# Patient Record
Sex: Male | Born: 1960
Health system: Southern US, Community
[De-identification: ages and names within clinical notes are randomized; demographics above are authoritative.]

## PROBLEM LIST (undated history)

## (undated) DIAGNOSIS — B009 Herpesviral infection, unspecified: Secondary | ICD-10-CM

## (undated) DIAGNOSIS — Z973 Presence of spectacles and contact lenses: Secondary | ICD-10-CM

## (undated) DIAGNOSIS — L309 Dermatitis, unspecified: Secondary | ICD-10-CM

## (undated) DIAGNOSIS — C61 Malignant neoplasm of prostate: Secondary | ICD-10-CM

## (undated) DIAGNOSIS — R202 Paresthesia of skin: Secondary | ICD-10-CM

## (undated) DIAGNOSIS — K219 Gastro-esophageal reflux disease without esophagitis: Secondary | ICD-10-CM

## (undated) DIAGNOSIS — E785 Hyperlipidemia, unspecified: Secondary | ICD-10-CM

## (undated) HISTORY — PX: OTHER SURGICAL HISTORY: SHX169

## (undated) HISTORY — DX: Hyperlipidemia, unspecified: E78.5

## (undated) HISTORY — DX: Herpesviral infection, unspecified: B00.9

## (undated) HISTORY — DX: Paresthesia of skin: R20.2

---

## 2013-02-08 ENCOUNTER — Encounter: Payer: Self-pay | Admitting: Neurology

## 2013-02-08 DIAGNOSIS — E785 Hyperlipidemia, unspecified: Secondary | ICD-10-CM | POA: Insufficient documentation

## 2013-02-08 DIAGNOSIS — R202 Paresthesia of skin: Secondary | ICD-10-CM | POA: Insufficient documentation

## 2013-02-13 ENCOUNTER — Encounter: Payer: Self-pay | Admitting: Neurology

## 2013-02-13 ENCOUNTER — Ambulatory Visit (INDEPENDENT_AMBULATORY_CARE_PROVIDER_SITE_OTHER): Payer: BC Managed Care – PPO | Admitting: Neurology

## 2013-02-13 VITALS — BP 147/94 | HR 62 | Ht 68.0 in | Wt 185.0 lb

## 2013-02-13 DIAGNOSIS — R209 Unspecified disturbances of skin sensation: Secondary | ICD-10-CM

## 2013-02-13 DIAGNOSIS — R202 Paresthesia of skin: Secondary | ICD-10-CM

## 2013-02-13 DIAGNOSIS — E785 Hyperlipidemia, unspecified: Secondary | ICD-10-CM

## 2013-02-13 NOTE — Progress Notes (Signed)
[facility]  PATIENT: Peter Stevens DOB: 1961/04/25  HISTORICAL  Peter Stevens is a 52 years old right-handed Caucasian male, referred by his primary care physician Dr. Kirby Funk for evaluation of intermittent paresthesia.  He had past medical history of hyperlipidemia, anxiety, since August 2014, he complains of worsening anxiety, also complains of intermittent episode of patchy area of numbness, involving his arms, legs, he denies weakness, no visual loss, no low back pain Symptoms are intermittent, getting worse under stressful situations  He is concerned about the possibility of MS, cervical spine lesions after surfing the internet, he desires further evaluations  REVIEW OF SYSTEMS: Full 14 system review of systems performed and notable only for ringing in ears, snoring, allergy, headache, numbness, dizziness  ALLERGIES: Allergies  Allergen Reactions  . Lipitor [Atorvastatin]   . Penicillins     HOME MEDICATIONS: Outpatient Prescriptions Prior to Visit  Medication Sig Dispense Refill  . ezetimibe (ZETIA) 10 MG tablet Take 10 mg by mouth daily.      . vitamin B-12 (CYANOCOBALAMIN) 1000 MCG tablet Take 1,000 mcg by mouth daily.       No facility-administered medications prior to visit.    PAST MEDICAL HISTORY: Past Medical History  Diagnosis Date  . Hyperlipemia   . Herpes simplex   . Paresthesia     PAST SURGICAL HISTORY: No past surgical history on file.  FAMILY HISTORY: Family History  Problem Relation Age of Onset  . Prostate cancer Father   . Heart attack Father     SOCIAL HISTORY:  History   Social History  . Marital Status: Single    Spouse Name: N/A    Number of Children: 0   . Years of Education: college   Occupational History  .      Engineer   Social History Main Topics  . Smoking status: Never Smoker   . Smokeless tobacco: Never Used  . Alcohol Use: 2 - 2.5 oz/week    4-5 drink(s) per week     Comment: weekly    . Drug Use: No  . Sexual Activity: Not on file   Other Topics Concern  . Not on file   Social History Narrative   Engineer.   College education BSME   Patient lives with a friend Arnoldo Lenis.    Patient works at [employer].    Patient does not have any children.      PHYSICAL EXAM   Filed Vitals:   02/13/13 0925  BP: 147/94  Pulse: 62  Height: 5\' 8"  (1.727 m)  Weight: 185 lb (83.915 kg)    Not recorded    Body mass index is 28.14 kg/(m^2).   Generalized: In no acute distress  Neck: Supple, no carotid bruits   Cardiac: Regular rate rhythm  Pulmonary: Clear to auscultation bilaterally  Musculoskeletal: No deformity  Neurological examination  Mentation: Alert oriented to time, place, history taking, and causual conversation  Cranial nerve II-XII: Pupils were equal round reactive to light extraocular movements were full, visual field were full on confrontational test. facial sensation and strength were normal. hearing was intact to finger rubbing bilaterally. Uvula tongue midline.  head turning and shoulder shrug and were normal and symmetric.Tongue protrusion into cheek strength was normal.  Motor: normal tone, bulk and strength.  Sensory: Intact to fine touch, pinprick, preserved vibratory sensation, and proprioception at toes.  Coordination: Normal finger to nose, heel-to-shin bilaterally there was no truncal ataxia  Gait: Rising up from  seated position without assistance, normal stance, without trunk ataxia, moderate stride, good arm swing, smooth turning, able to perform tiptoe, and heel walking without difficulty.   Romberg signs: Negative  Deep tendon reflexes: Brachioradialis 2/2, biceps 2/2, triceps 2/2, patellar 2/2, Achilles 2/2, plantar responses were flexor bilaterally.   DIAGNOSTIC DATA (LABS, IMAGING, TESTING) - I reviewed patient records, labs, notes, testing and imaging myself where available.   ASSESSMENT AND PLAN   52 years old  gentleman, complains of anxiety, hyperlipidemia, with intermittent patchy area of paresthesia, normal neurological examination, laboratory evaluations.   He desires further evaluations.  complete evaluation with MRI of the brain, and cervical    Levert Feinstein, M.D. Ph.D.  [facility] [address], [address] [place], [place] [address] [phone]

## 2013-02-14 NOTE — Addendum Note (Signed)
Addended by: Levert Feinstein on: 02/14/2013 01:08 PM   Modules accepted: Orders

## 2013-02-23 ENCOUNTER — Ambulatory Visit (INDEPENDENT_AMBULATORY_CARE_PROVIDER_SITE_OTHER): Payer: BC Managed Care – PPO

## 2013-02-23 DIAGNOSIS — E785 Hyperlipidemia, unspecified: Secondary | ICD-10-CM

## 2013-02-23 DIAGNOSIS — R202 Paresthesia of skin: Secondary | ICD-10-CM

## 2013-02-23 DIAGNOSIS — R209 Unspecified disturbances of skin sensation: Secondary | ICD-10-CM

## 2013-02-27 NOTE — Progress Notes (Signed)
Quick Note:  Called patient and shared Dr Zannie Cove findings as listed below: no answer, lt voicemail message of results. ______

## 2013-02-27 NOTE — Progress Notes (Signed)
Quick Note:  Please call Patient, there is no evidence of MS on MRI brain and cervical spine, age related changes on MRI brain, mild cervical degenerative disease, no intrinsic cord lesions.   ______

## 2013-03-01 NOTE — Progress Notes (Signed)
Quick Note:  Please call patient, MRI cervical showed mild degenerative disease, no acute lesions. ______

## 2014-08-01 ENCOUNTER — Other Ambulatory Visit: Payer: Self-pay | Admitting: Internal Medicine

## 2014-08-01 ENCOUNTER — Ambulatory Visit
Admission: RE | Admit: 2014-08-01 | Discharge: 2014-08-01 | Disposition: A | Discharge status: Home / Self Care | Payer: BLUE CROSS/BLUE SHIELD | Source: Ambulatory Visit | Attending: Internal Medicine | Admitting: Internal Medicine

## 2014-08-01 DIAGNOSIS — R1031 Right lower quadrant pain: Secondary | ICD-10-CM

## 2014-08-01 IMAGING — CT CT ABD-PELV W/ CM
3 of 5 series · 13 of 36 positions shown, 19 images · IV contrast (READICAT/WATER & [ID] ISOVUE 300)
Comparison: None.

CLINICAL DATA: Two day history of right lower quadrant pain

EXAM:
CT ABDOMEN AND PELVIS WITH CONTRAST
TECHNIQUE: Multidetector CT imaging of the abdomen and pelvis was performed
using the standard protocol following bolus administration of
intravenous contrast. Oral contrast was also administered.
CONTRAST:  100 mL Isovue 300 nonionic

[Series 3: abd/pelvis with · axial · 0.76mm/px · z∈[-415,-40]mm · 8 of 96 slices shown, 13 images]
[im 11/96  soft-tissue]
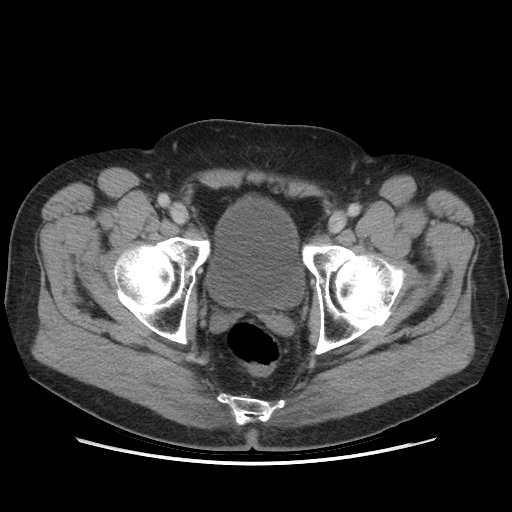
[im 11/96  bone]
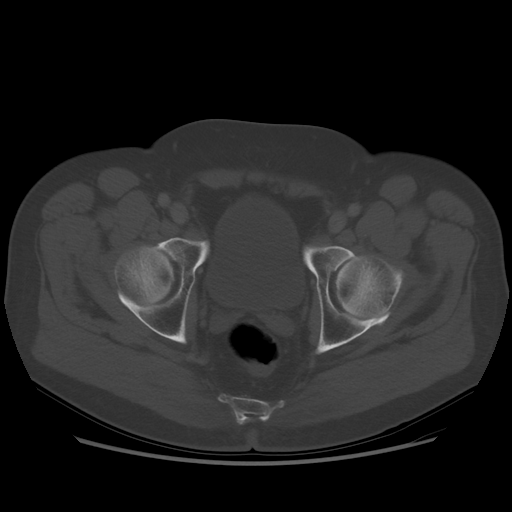
[im 22/96  soft-tissue]
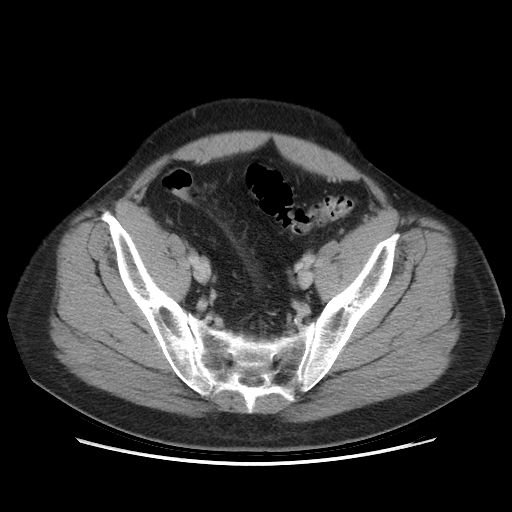
[im 32/96  soft-tissue]
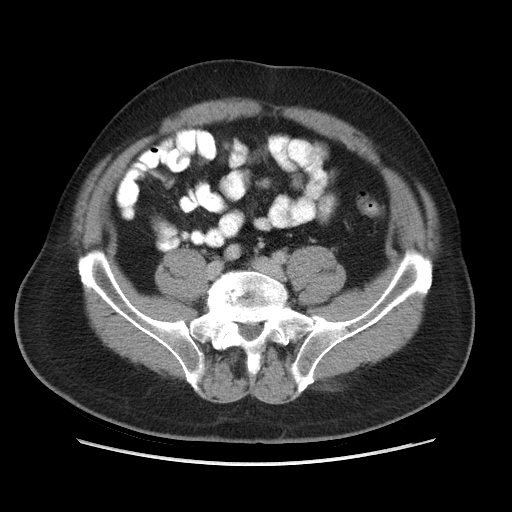
[im 43/96  soft-tissue]
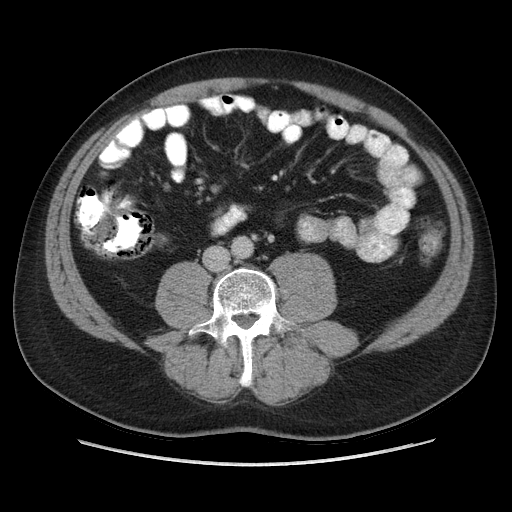
[im 53/96  soft-tissue]
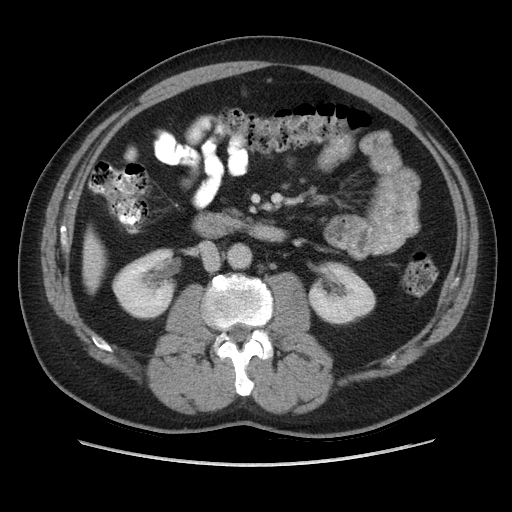
[im 53/96  lung]
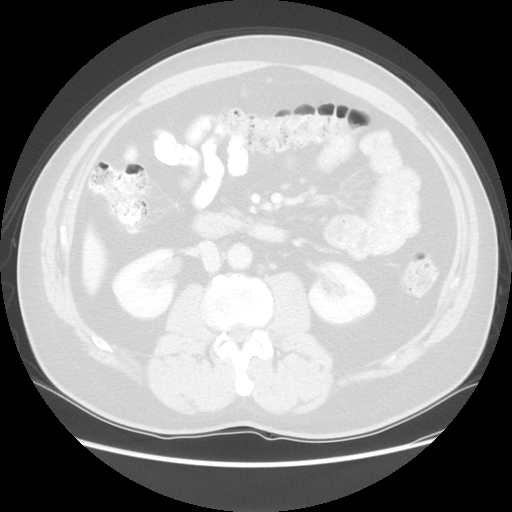
[im 64/96  soft-tissue]
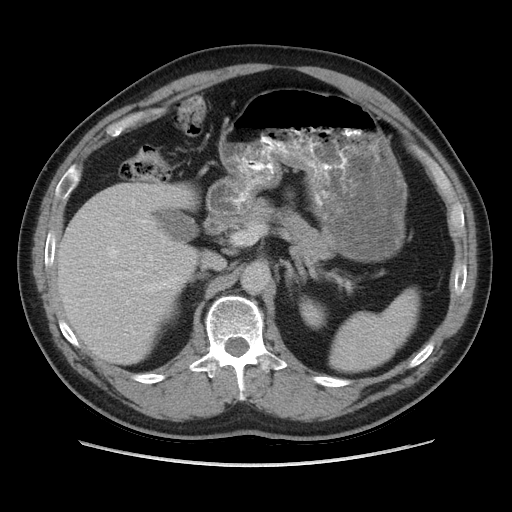
[im 64/96  lung]
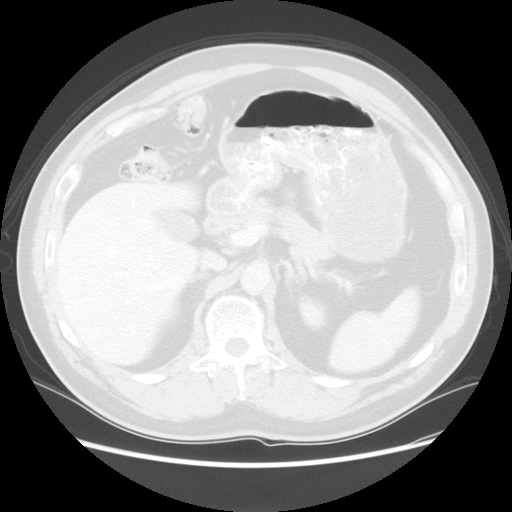
[im 74/96  soft-tissue]
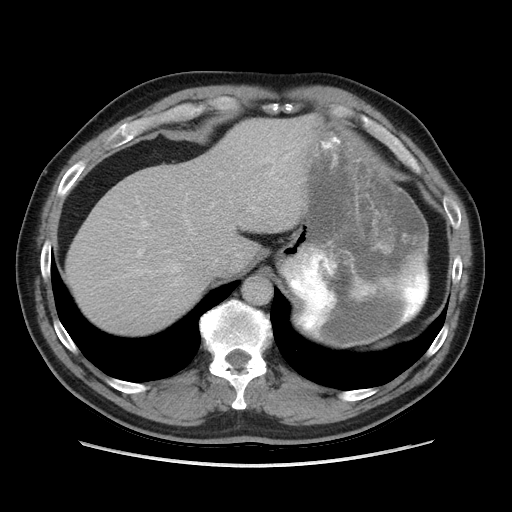
[im 74/96  lung]
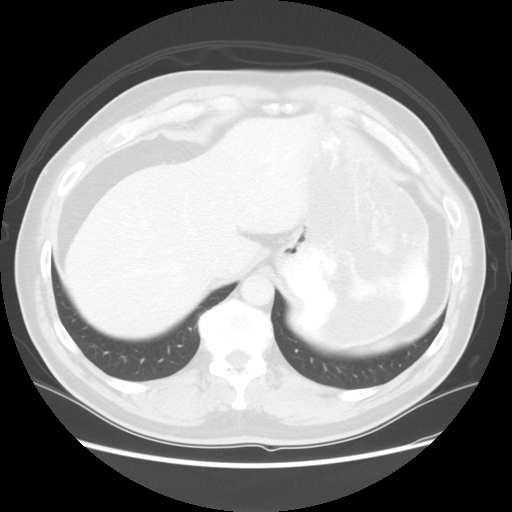
[im 85/96  soft-tissue]
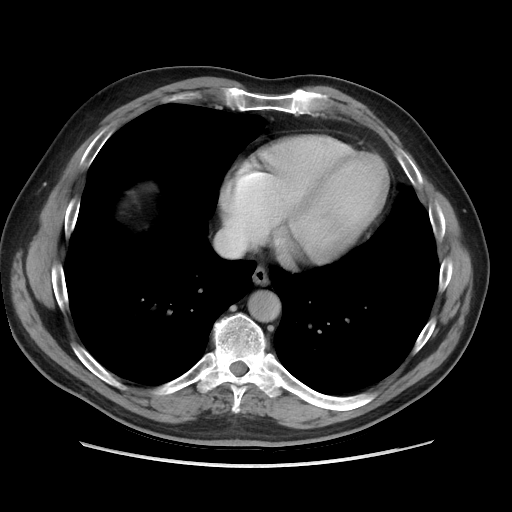
[im 85/96  lung]
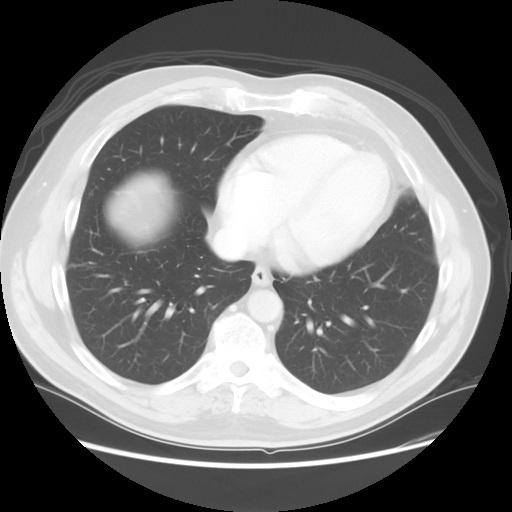

[Series 601: coronal body · coronal · 1.02mm/px · 1 of 130 slices shown, 2 images]
[im 44/130  soft-tissue]
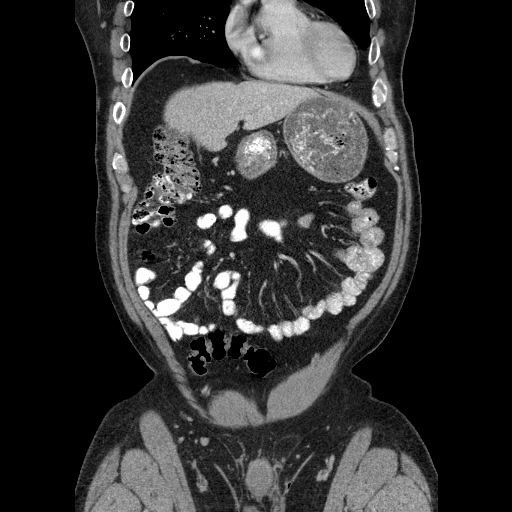
[im 44/130  bone]
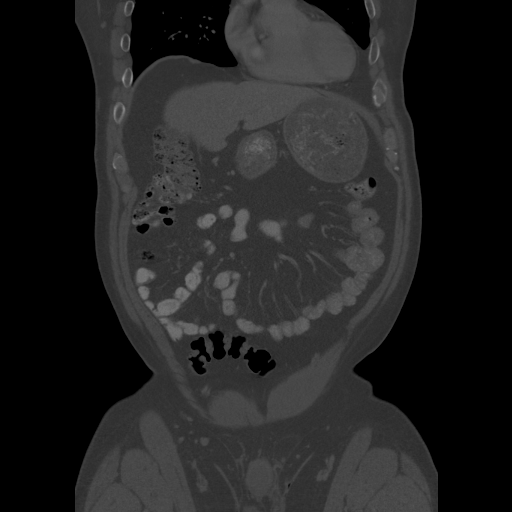

[Series 602: sagittal body · sagittal · 1.02mm/px · 4 of 157 slices shown]
[im 11/157  soft-tissue]
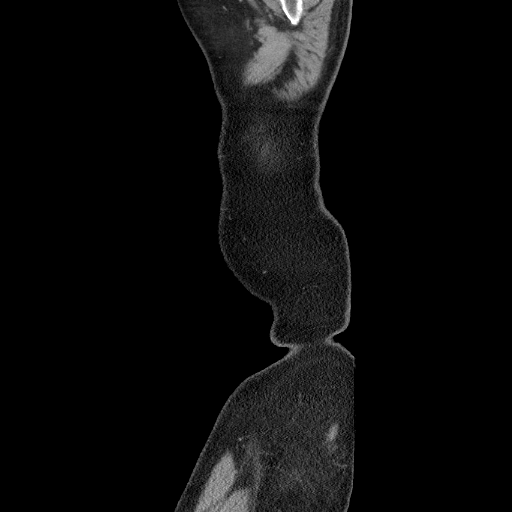
[im 32/157  soft-tissue]
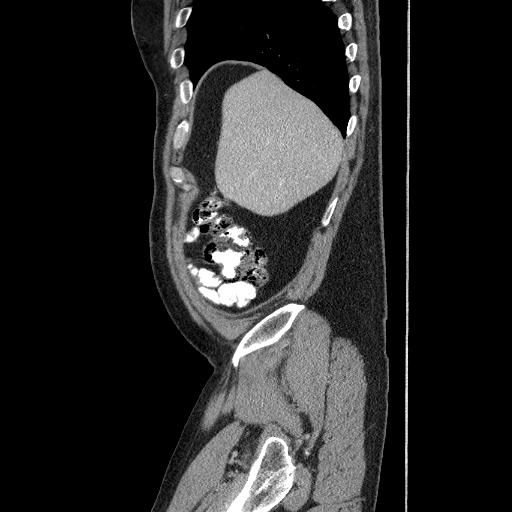
[im 53/157  soft-tissue]
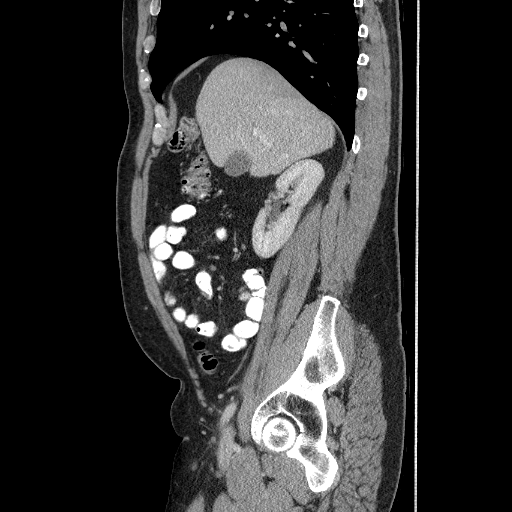
[im 73/157  soft-tissue]
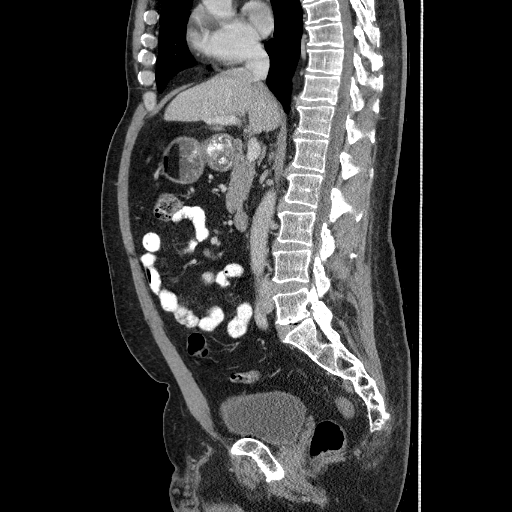

[13 of 36 positions shown; findings below may reference images not displayed]

FINDINGS: Lung bases are clear.

No focal liver lesions are identified. Gallbladder wall is not
thickened. There is no biliary duct dilatation.

Spleen, pancreas, and adrenals appear normal. Kidneys bilaterally
show no appreciable mass or hydronephrosis on either side. There is
no renal or ureteral calculus on either side.

In the pelvis, the urinary bladder is midline with normal wall
thickness. There is no pelvic mass or pelvic fluid collection. There
are scattered sigmoid diverticula without diverticulitis.

Appendix appears within normal limits. There are scattered small
lymph nodes in the right mid to lower quadrant region. There is no
adenopathy in the abdomen or pelvis by size criteria.

There is no bowel obstruction. No free air or portal venous air.
There are scattered diverticula throughout the colon without
diverticulitis.

There is no ascites or abscess in the abdomen or pelvis. There is no
demonstrable abdominal aortic aneurysm. There is degenerative change
in the lumbar spine. No blastic or lytic bone lesions.
IMPRESSION: Appendix appears normal. Nearby small lymph nodes may represent a
degree of mesenteric adenitis.

No bowel obstruction. No abscess. Scattered colonic diverticula
without diverticulitis.

## 2014-08-01 MED ORDER — IOPAMIDOL (ISOVUE-300) INJECTION 61%
100.0000 mL | Freq: Once | INTRAVENOUS | Status: AC | PRN
  Administered 2014-08-01: 100 mL via INTRAVENOUS

## 2016-03-26 DIAGNOSIS — H524 Presbyopia: Secondary | ICD-10-CM | POA: Diagnosis not present

## 2016-09-16 DIAGNOSIS — L989 Disorder of the skin and subcutaneous tissue, unspecified: Secondary | ICD-10-CM | POA: Diagnosis not present

## 2017-01-21 DIAGNOSIS — L989 Disorder of the skin and subcutaneous tissue, unspecified: Secondary | ICD-10-CM | POA: Diagnosis not present

## 2017-01-21 DIAGNOSIS — Z0000 Encounter for general adult medical examination without abnormal findings: Secondary | ICD-10-CM | POA: Diagnosis not present

## 2017-01-21 DIAGNOSIS — Z125 Encounter for screening for malignant neoplasm of prostate: Secondary | ICD-10-CM | POA: Diagnosis not present

## 2017-01-21 DIAGNOSIS — E7800 Pure hypercholesterolemia, unspecified: Secondary | ICD-10-CM | POA: Diagnosis not present

## 2017-01-21 DIAGNOSIS — R070 Pain in throat: Secondary | ICD-10-CM | POA: Diagnosis not present

## 2017-01-21 DIAGNOSIS — R972 Elevated prostate specific antigen [PSA]: Secondary | ICD-10-CM | POA: Diagnosis not present

## 2017-02-01 DIAGNOSIS — Z7289 Other problems related to lifestyle: Secondary | ICD-10-CM | POA: Diagnosis not present

## 2017-02-01 DIAGNOSIS — J343 Hypertrophy of nasal turbinates: Secondary | ICD-10-CM | POA: Diagnosis not present

## 2017-02-01 DIAGNOSIS — K219 Gastro-esophageal reflux disease without esophagitis: Secondary | ICD-10-CM | POA: Diagnosis not present

## 2017-07-12 DIAGNOSIS — L308 Other specified dermatitis: Secondary | ICD-10-CM | POA: Diagnosis not present

## 2017-07-12 DIAGNOSIS — L821 Other seborrheic keratosis: Secondary | ICD-10-CM | POA: Diagnosis not present

## 2017-07-12 DIAGNOSIS — L578 Other skin changes due to chronic exposure to nonionizing radiation: Secondary | ICD-10-CM | POA: Diagnosis not present

## 2017-07-12 DIAGNOSIS — D1801 Hemangioma of skin and subcutaneous tissue: Secondary | ICD-10-CM | POA: Diagnosis not present

## 2017-07-26 DIAGNOSIS — R972 Elevated prostate specific antigen [PSA]: Secondary | ICD-10-CM | POA: Diagnosis not present

## 2017-11-18 DIAGNOSIS — M545 Low back pain: Secondary | ICD-10-CM | POA: Diagnosis not present

## 2018-02-08 DIAGNOSIS — R972 Elevated prostate specific antigen [PSA]: Secondary | ICD-10-CM | POA: Diagnosis not present

## 2018-04-26 DIAGNOSIS — H04123 Dry eye syndrome of bilateral lacrimal glands: Secondary | ICD-10-CM | POA: Diagnosis not present

## 2018-05-24 DIAGNOSIS — Z880 Allergy status to penicillin: Secondary | ICD-10-CM | POA: Diagnosis not present

## 2018-05-24 DIAGNOSIS — K219 Gastro-esophageal reflux disease without esophagitis: Secondary | ICD-10-CM | POA: Diagnosis not present

## 2018-05-24 DIAGNOSIS — A000 Cholera due to Vibrio cholerae 01, biovar cholerae: Secondary | ICD-10-CM | POA: Diagnosis not present

## 2018-05-24 DIAGNOSIS — S72102A Unspecified trochanteric fracture of left femur, initial encounter for closed fracture: Secondary | ICD-10-CM | POA: Diagnosis not present

## 2018-05-24 DIAGNOSIS — Z888 Allergy status to other drugs, medicaments and biological substances status: Secondary | ICD-10-CM | POA: Diagnosis not present

## 2018-05-24 DIAGNOSIS — Y92219 Unspecified school as the place of occurrence of the external cause: Secondary | ICD-10-CM | POA: Diagnosis not present

## 2018-05-24 DIAGNOSIS — S72142A Displaced intertrochanteric fracture of left femur, initial encounter for closed fracture: Secondary | ICD-10-CM | POA: Diagnosis not present

## 2018-05-24 DIAGNOSIS — E559 Vitamin D deficiency, unspecified: Secondary | ICD-10-CM | POA: Diagnosis not present

## 2018-05-24 DIAGNOSIS — W138XXA Fall from, out of or through other building or structure, initial encounter: Secondary | ICD-10-CM | POA: Diagnosis not present

## 2018-05-24 DIAGNOSIS — S728X2A Other fracture of left femur, initial encounter for closed fracture: Secondary | ICD-10-CM | POA: Diagnosis not present

## 2018-05-24 DIAGNOSIS — M79605 Pain in left leg: Secondary | ICD-10-CM | POA: Diagnosis not present

## 2018-05-24 DIAGNOSIS — S72352A Displaced comminuted fracture of shaft of left femur, initial encounter for closed fracture: Secondary | ICD-10-CM | POA: Diagnosis not present

## 2018-05-24 DIAGNOSIS — R079 Chest pain, unspecified: Secondary | ICD-10-CM | POA: Diagnosis not present

## 2018-05-24 DIAGNOSIS — Y93H3 Activity, building and construction: Secondary | ICD-10-CM | POA: Diagnosis not present

## 2018-05-24 DIAGNOSIS — Z4789 Encounter for other orthopedic aftercare: Secondary | ICD-10-CM | POA: Diagnosis not present

## 2018-05-24 DIAGNOSIS — W19XXXA Unspecified fall, initial encounter: Secondary | ICD-10-CM | POA: Diagnosis not present

## 2018-05-24 DIAGNOSIS — M79652 Pain in left thigh: Secondary | ICD-10-CM | POA: Diagnosis not present

## 2018-05-24 DIAGNOSIS — Y990 Civilian activity done for income or pay: Secondary | ICD-10-CM | POA: Diagnosis not present

## 2018-05-24 DIAGNOSIS — S40811A Abrasion of right upper arm, initial encounter: Secondary | ICD-10-CM | POA: Diagnosis not present

## 2018-05-24 DIAGNOSIS — S7292XA Unspecified fracture of left femur, initial encounter for closed fracture: Secondary | ICD-10-CM | POA: Diagnosis not present

## 2018-05-24 DIAGNOSIS — M62838 Other muscle spasm: Secondary | ICD-10-CM | POA: Diagnosis not present

## 2018-05-24 DIAGNOSIS — S299XXA Unspecified injury of thorax, initial encounter: Secondary | ICD-10-CM | POA: Diagnosis not present

## 2018-05-24 DIAGNOSIS — R102 Pelvic and perineal pain: Secondary | ICD-10-CM | POA: Diagnosis not present

## 2018-05-24 DIAGNOSIS — Z79899 Other long term (current) drug therapy: Secondary | ICD-10-CM | POA: Diagnosis not present

## 2018-05-24 HISTORY — PX: OTHER SURGICAL HISTORY: SHX169

## 2018-05-25 DIAGNOSIS — S728X2A Other fracture of left femur, initial encounter for closed fracture: Secondary | ICD-10-CM | POA: Diagnosis not present

## 2018-05-25 DIAGNOSIS — Z4789 Encounter for other orthopedic aftercare: Secondary | ICD-10-CM | POA: Diagnosis not present

## 2018-05-25 DIAGNOSIS — M79652 Pain in left thigh: Secondary | ICD-10-CM | POA: Diagnosis not present

## 2018-05-25 DIAGNOSIS — W19XXXA Unspecified fall, initial encounter: Secondary | ICD-10-CM | POA: Diagnosis not present

## 2018-06-01 DIAGNOSIS — A000 Cholera due to Vibrio cholerae 01, biovar cholerae: Secondary | ICD-10-CM | POA: Diagnosis not present

## 2018-06-02 DIAGNOSIS — E7800 Pure hypercholesterolemia, unspecified: Secondary | ICD-10-CM | POA: Diagnosis not present

## 2018-06-02 DIAGNOSIS — Z7901 Long term (current) use of anticoagulants: Secondary | ICD-10-CM | POA: Diagnosis not present

## 2018-06-02 DIAGNOSIS — S72302D Unspecified fracture of shaft of left femur, subsequent encounter for closed fracture with routine healing: Secondary | ICD-10-CM | POA: Diagnosis not present

## 2018-06-02 DIAGNOSIS — Z9181 History of falling: Secondary | ICD-10-CM | POA: Diagnosis not present

## 2018-06-07 DIAGNOSIS — Z9181 History of falling: Secondary | ICD-10-CM | POA: Diagnosis not present

## 2018-06-07 DIAGNOSIS — E7800 Pure hypercholesterolemia, unspecified: Secondary | ICD-10-CM | POA: Diagnosis not present

## 2018-06-07 DIAGNOSIS — S72102A Unspecified trochanteric fracture of left femur, initial encounter for closed fracture: Secondary | ICD-10-CM | POA: Diagnosis not present

## 2018-06-07 DIAGNOSIS — Z7901 Long term (current) use of anticoagulants: Secondary | ICD-10-CM | POA: Diagnosis not present

## 2018-06-07 DIAGNOSIS — S72302A Unspecified fracture of shaft of left femur, initial encounter for closed fracture: Secondary | ICD-10-CM | POA: Diagnosis not present

## 2018-06-07 DIAGNOSIS — S72302D Unspecified fracture of shaft of left femur, subsequent encounter for closed fracture with routine healing: Secondary | ICD-10-CM | POA: Diagnosis not present

## 2018-06-10 DIAGNOSIS — E7800 Pure hypercholesterolemia, unspecified: Secondary | ICD-10-CM | POA: Diagnosis not present

## 2018-06-10 DIAGNOSIS — Z7901 Long term (current) use of anticoagulants: Secondary | ICD-10-CM | POA: Diagnosis not present

## 2018-06-10 DIAGNOSIS — S72302D Unspecified fracture of shaft of left femur, subsequent encounter for closed fracture with routine healing: Secondary | ICD-10-CM | POA: Diagnosis not present

## 2018-06-10 DIAGNOSIS — Z9181 History of falling: Secondary | ICD-10-CM | POA: Diagnosis not present

## 2018-06-13 DIAGNOSIS — S72002D Fracture of unspecified part of neck of left femur, subsequent encounter for closed fracture with routine healing: Secondary | ICD-10-CM | POA: Diagnosis not present

## 2018-06-13 DIAGNOSIS — Z79899 Other long term (current) drug therapy: Secondary | ICD-10-CM | POA: Diagnosis not present

## 2018-06-14 ENCOUNTER — Ambulatory Visit: Payer: BLUE CROSS/BLUE SHIELD | Attending: Orthopedic Surgery | Admitting: Physical Therapy

## 2018-06-14 ENCOUNTER — Other Ambulatory Visit: Payer: Self-pay

## 2018-06-14 DIAGNOSIS — R2689 Other abnormalities of gait and mobility: Secondary | ICD-10-CM | POA: Diagnosis not present

## 2018-06-14 DIAGNOSIS — M6281 Muscle weakness (generalized): Secondary | ICD-10-CM | POA: Diagnosis not present

## 2018-06-14 DIAGNOSIS — R262 Difficulty in walking, not elsewhere classified: Secondary | ICD-10-CM | POA: Insufficient documentation

## 2018-06-14 DIAGNOSIS — M79605 Pain in left leg: Secondary | ICD-10-CM

## 2018-06-14 DIAGNOSIS — M25652 Stiffness of left hip, not elsewhere classified: Secondary | ICD-10-CM | POA: Diagnosis not present

## 2018-06-14 NOTE — Therapy (Signed)
[address] [place], [place], [address] Phone: [phone]   Fax:  [phone]  Physical Therapy Evaluation  Patient Details  Name: Peter Stevens MRN: [id number] Date of Birth: 1961-01-07 Referring Provider (PT): Ainsley Spinner PA-C; Altamese Wakonda MD   Encounter Date: 06/14/2018  PT End of Session - 06/14/18 1435    Visit Number  1    Date for PT Re-Evaluation  08/09/18    PT Start Time  1435    PT Stop Time  1520    PT Time Calculation (min)  45 min    Activity Tolerance  Patient tolerated treatment well;Patient limited by pain    Behavior During Therapy  [facility] for tasks assessed/performed       Past Medical History:  Diagnosis Date  . Herpes simplex   . Hyperlipemia   . Paresthesia     History reviewed. No pertinent surgical history.  There were no vitals filed for this visit.   Subjective Assessment - 06/14/18 1441    Subjective  Patient fell 11 feet from an attic truss through the roof and fractured his L femur and had surgery on 05/24/18. Surgery was in [place] as that is where he fell. He is now seeing Dr. Marcelino Scot.  He reports pain with any sudden movement of the leg, but otherwise mild pain with movement.     Pertinent History  unrmemarkable    Patient Stated Goals  get back to walking normally    Currently in Pain?  Yes    Pain Score  1     Pain Location  Leg    Pain Orientation  Left    Pain Descriptors / Indicators  Cramping    Pain Type  Acute pain    Pain Radiating Towards  horizontally across quad, intermittently into groin    Pain Onset  1 to 4 weeks ago    Pain Frequency  Intermittent    Aggravating Factors   movement    Pain Relieving Factors  rest, ice    Effect of Pain on Daily Activities  unable to work fully and perform ADLS         [facility] PT Assessment - 06/14/18 0001      Assessment   Medical Diagnosis  left femur fracture    Referring Provider (PT)  Ainsley Spinner PA-C;  Altamese Waycross MD    Onset Date/Surgical Date  05/24/18    Next MD Visit  07/08/18    Prior Therapy  3 HHPT      Precautions   Precautions  None      Restrictions   Weight Bearing Restrictions  Yes    LLE Weight Bearing  Weight bearing as tolerated      Balance Screen   Has the patient fallen in the past 6 months  No    Has the patient had a decrease in activity level because of a fear of falling?   No    Is the patient reluctant to leave their home because of a fear of falling?   No      Home Environment   Living Environment  Private residence    Living Arrangements  Spouse/significant other    Type of [place] Access  Level entry    [place];Able to live on main level with bedroom/bathroom    [place] - 2 wheels;Crutches  Prior Function   Level of Independence  Independent    Vocation  Full time employment    Careers adviser, climbing in houses     Leisure  normal activities      ROM / Strength   AROM / PROM / Strength  AROM;PROM;Strength      AROM   Overall AROM Comments  104 deg knee flex    AROM Assessment Site  Hip    Right/Left Hip  Left    Left Hip Extension  --   WNL   Left Hip Flexion  53    Left Hip External Rotation   5   10 passive   Left Hip Internal Rotation   15   18 passive   Left Hip ABduction  13    Left Hip ADduction  --   WNL     Strength   Overall Strength Comments  grossly 4/5 in left hip; unable to initiate SLR, but can hold SLR isometrically once lifted; left knee ext 4+/5, HS 5/5      Palpation   Palpation comment  tender in left quads and gluteals      Bed Mobility   Bed Mobility  Sit to Supine    Sit to Supine  --   requires RLE assist      Ambulation/Gait   Ambulation/Gait  Yes    Ambulation/Gait Assistance  6: Modified independent (Device/Increase time)    Ambulation Distance (Feet)  20 Feet    Assistive device  Rolling walker    Gait Pattern  Step-to pattern     Ambulation Surface  Level    Gait Comments  partial WB                Objective measurements completed on examination: See above findings.              PT Education - 06/14/18 1529    Education Details  HEP    Person(s) Educated  Patient    Methods  Explanation;Demonstration    Comprehension  Verbalized understanding;Returned demonstration       PT Short Term Goals - 06/14/18 1535      PT SHORT TERM GOAL #1   Title  Ind with initial HEP    Time  2    Period  Weeks    Status  New    Target Date  06/28/18      PT SHORT TERM GOAL #2   Title  Pt able to amb with FWB and least restrictive AD.    Time  2    Period  Weeks    Status  New      PT SHORT TERM GOAL #3   Title  Patient able to perform sit to supine to sit transfer Independently    Time  4    Period  Weeks    Status  New    Target Date  07/12/18        PT Long Term Goals - 06/14/18 1536      PT LONG TERM GOAL #1   Title  Pt able to ambulate community distances without AD.    Time  8    Period  Weeks    Status  New    Target Date  08/09/18      PT LONG TERM GOAL #2   Title  Pt to demo left hip WFL to complete ADLS and work functions.    Time  8  Period  Weeks    Status  New      PT LONG TERM GOAL #3   Title  Patient able to perform ADLs with 2/10 pain or less in the left leg.    Time  8    Period  Weeks    Status  New      PT LONG TERM GOAL #4   Title  Patient to demo 5/5 left hip and knee strength to normalize ADLS.    Time  8    Period  Weeks    Status  New      PT LONG TERM GOAL #5   Title  Patient able to demonstrate good balance on LLE to prevent falls.    Time  8    Period  Weeks    Status  New             Plan - 06/14/18 1524    Clinical Impression Statement  Patient presents s/p left femur fx and IM Nail surgery on 05/24/18. He amb with a RW and is WBAT. He uses a step through gait and heel/toe pattern with the walker. He has marked limitations in ROM and  strength, but is compliant with current HEP. He has pain with end range movement and with sudden movements of the leg. He also reports fatigue and decreased endurance and would like to return to his PLOF.    History and Personal Factors relevant to plan of care:  femur fx/surgery 05/24/17    Clinical Presentation  Stable    Clinical Decision Making  Low    Rehab Potential  Excellent    PT Frequency  2x / week    PT Duration  8 weeks    PT Treatment/Interventions  ADLs/Self Care Home Management;Cryotherapy;Electrical Stimulation;Moist Heat;Therapeutic exercise;Balance training;Neuromuscular re-education;Stair training;Gait training;Patient/family education;Manual techniques;Vasopneumatic Device    PT Next Visit Plan  hip ROM/strengthening; gait training    PT Home Exercise Plan  [email or web address]    Consulted and Agree with Plan of Care  Patient       Patient will benefit from skilled therapeutic intervention in order to improve the following deficits and impairments:  Abnormal gait, Pain, Decreased mobility, Decreased range of motion, Decreased strength  Visit Diagnosis: Stiffness of left hip, not elsewhere classified - Plan: PT plan of care cert/re-cert  Pain in left leg - Plan: PT plan of care cert/re-cert  Muscle weakness (generalized) - Plan: PT plan of care cert/re-cert  Other abnormalities of gait and mobility - Plan: PT plan of care cert/re-cert     Problem List Patient Active Problem List   Diagnosis Date Noted  . Hyperlipemia   . Paresthesia     Madelyn Flavors PT 06/14/2018, 3:46 PM  [address] [place], [place], [address] Phone: [phone]   Fax:  [phone]  Name: SORA OLIVO MRN: [id number] Date of Birth: [birth year]

## 2018-06-14 NOTE — Patient Instructions (Signed)
Access Code: [email or web address]  URL: [email or web address]  Date: 06/14/2018  Prepared by: Madelyn Flavors   Exercises  Supine March - 10 reps - 3 sets - 2x daily - 7x weekly  Supine Heel Slide - 10 reps - 3 sets - 2x daily - 7x weekly  Standing 3-Way Kick - 10 reps - 3 sets - 2x daily - 7x weekly  Seated Hip Internal Rotation AROM - 10 reps - 3 sets - 2x daily - 7x weekly  Seated Hip External Rotation AROM - 10 reps - 3 sets - 2x daily - 7x weekly  Supine Bridge - 10 reps - 1-2 sets - 2-3 sec hold - 2x daily - 7x weekly

## 2018-06-16 ENCOUNTER — Encounter: Payer: Self-pay | Admitting: Physical Therapy

## 2018-06-16 ENCOUNTER — Ambulatory Visit: Payer: BLUE CROSS/BLUE SHIELD | Admitting: Physical Therapy

## 2018-06-16 DIAGNOSIS — M79605 Pain in left leg: Secondary | ICD-10-CM | POA: Diagnosis not present

## 2018-06-16 DIAGNOSIS — R2689 Other abnormalities of gait and mobility: Secondary | ICD-10-CM

## 2018-06-16 DIAGNOSIS — M25652 Stiffness of left hip, not elsewhere classified: Secondary | ICD-10-CM

## 2018-06-16 DIAGNOSIS — R262 Difficulty in walking, not elsewhere classified: Secondary | ICD-10-CM | POA: Diagnosis not present

## 2018-06-16 DIAGNOSIS — M6281 Muscle weakness (generalized): Secondary | ICD-10-CM | POA: Diagnosis not present

## 2018-06-16 NOTE — Therapy (Signed)
[address] [place], [place], [address] Phone: [phone]   Fax:  [phone]  Physical Therapy Treatment  Patient Details  Name: MYLIN HIRANO MRN: [id number] Date of Birth: [birth year] Referring Provider (PT): Ainsley Spinner PA-C; Altamese Thornton MD   Encounter Date: 06/16/2018  PT End of Session - 06/16/18 1239    Visit Number  2    Date for PT Re-Evaluation  08/09/18    PT Start Time  1057    PT Stop Time  1143    PT Time Calculation (min)  46 min    Activity Tolerance  Patient tolerated treatment well;Patient limited by pain    Behavior During Therapy  [facility] for tasks assessed/performed       Past Medical History:  Diagnosis Date  . Herpes simplex   . Hyperlipemia   . Paresthesia     History reviewed. No pertinent surgical history.  There were no vitals filed for this visit.  Subjective Assessment - 06/16/18 1101    Subjective  Patient reports a little tightness in the left quad, reports that he feels he can do the HEP    Currently in Pain?  Yes    Pain Score  2     Pain Location  Leg    Pain Orientation  Left;Upper    Pain Descriptors / Indicators  Tightness                       [facility] Adult PT Treatment/Exercise - 06/16/18 0001      Ambulation/Gait   Gait Comments  use of FWW, education and cues for step through gait, also worked on the [facility] with HHA and gait belt trying to get step through gait with this.  He has a little pain but tends to report fear with some pain is why he has such a short stance phase      Exercises   Exercises  Knee/Hip      Knee/Hip Exercises: Aerobic   Nustep  level 3 x 6 minutes      Knee/Hip Exercises: Machines for Strengthening   Cybex Knee Extension  5# 3x10, then took the weight off and did left leg only cues to get to TKE    Cybex Knee Flexion  25# 3x10    Cybex Leg Press  20# 2x10, then left only without weight      Knee/Hip Exercises:  Standing   Hip Flexion  2 sets;10 reps    Hip Flexion Limitations  had him do both and really work on weight bearing through the left    Hip Abduction  Both;2 sets;10 reps    Abduction Limitations  a lot of cues for left LE weight bearing               PT Short Term Goals - 06/14/18 1535      PT SHORT TERM GOAL #1   Title  Ind with initial HEP    Time  2    Period  Weeks    Status  New    Target Date  06/28/18      PT SHORT TERM GOAL #2   Title  Pt able to amb with FWB and least restrictive AD.    Time  2    Period  Weeks    Status  New      PT SHORT TERM GOAL #3   Title  Patient able to  perform sit to supine to sit transfer Independently    Time  4    Period  Weeks    Status  New    Target Date  07/12/18        PT Long Term Goals - 06/14/18 1536      PT LONG TERM GOAL #1   Title  Pt able to ambulate community distances without AD.    Time  8    Period  Weeks    Status  New    Target Date  08/09/18      PT LONG TERM GOAL #2   Title  Pt to demo left hip WFL to complete ADLS and work functions.    Time  8    Period  Weeks    Status  New      PT LONG TERM GOAL #3   Title  Patient able to perform ADLs with 2/10 pain or less in the left leg.    Time  8    Period  Weeks    Status  New      PT LONG TERM GOAL #4   Title  Patient to demo 5/5 left hip and knee strength to normalize ADLS.    Time  8    Period  Weeks    Status  New      PT LONG TERM GOAL #5   Title  Patient able to demonstrate good balance on LLE to prevent falls.    Time  8    Period  Weeks    Status  New            Plan - 06/16/18 1239    Clinical Impression Statement  Patient did well with first attempts at exercise, he is hesitant but reports he is surprised that it was not too painful.  His gait is very poor, he is WBAT but bears partial weight, I worked a lot with him today using a SPC and with the walker to do a step through pattern and bear weight wihtout hopping     PT  Next Visit Plan  see how sore he was and progress as tolerated, trying to get him to trust the leg    Consulted and Agree with Plan of Care  Patient       Patient will benefit from skilled therapeutic intervention in order to improve the following deficits and impairments:  Abnormal gait, Pain, Decreased mobility, Decreased range of motion, Decreased strength  Visit Diagnosis: Stiffness of left hip, not elsewhere classified  Pain in left leg  Muscle weakness (generalized)  Other abnormalities of gait and mobility  Difficulty in walking, not elsewhere classified     Problem List Patient Active Problem List   Diagnosis Date Noted  . Hyperlipemia   . Paresthesia     Sumner Boast., PT 06/16/2018, 12:41 PM  [address] [place], [place], [address] Phone: [phone]   Fax:  [phone]  Name: WINFORD HEHN MRN: [id number] Date of Birth: August 13, 1960

## 2018-06-21 ENCOUNTER — Ambulatory Visit: Payer: BLUE CROSS/BLUE SHIELD | Admitting: Physical Therapy

## 2018-06-21 ENCOUNTER — Encounter: Payer: Self-pay | Admitting: Physical Therapy

## 2018-06-21 DIAGNOSIS — M6281 Muscle weakness (generalized): Secondary | ICD-10-CM

## 2018-06-21 DIAGNOSIS — R262 Difficulty in walking, not elsewhere classified: Secondary | ICD-10-CM

## 2018-06-21 DIAGNOSIS — R2689 Other abnormalities of gait and mobility: Secondary | ICD-10-CM

## 2018-06-21 DIAGNOSIS — M25652 Stiffness of left hip, not elsewhere classified: Secondary | ICD-10-CM | POA: Diagnosis not present

## 2018-06-21 DIAGNOSIS — M79605 Pain in left leg: Secondary | ICD-10-CM | POA: Diagnosis not present

## 2018-06-21 NOTE — Therapy (Signed)
[address] [place], [place], [address] Phone: [phone]   Fax:  [phone]  Physical Therapy Treatment  Patient Details  Name: Peter Stevens MRN: [id number] Date of Birth: Mar 11, 1961 Referring Provider (PT): Ainsley Spinner PA-C; Altamese [place] MD   Encounter Date: 06/21/2018  PT End of Session - 06/21/18 1358    Visit Number  3    Date for PT Re-Evaluation  08/09/18    PT Start Time  1300    PT Stop Time  1350    PT Time Calculation (min)  50 min    Activity Tolerance  Patient tolerated treatment well;Patient limited by pain    Behavior During Therapy  [facility] for tasks assessed/performed       Past Medical History:  Diagnosis Date  . Herpes simplex   . Hyperlipemia   . Paresthesia     History reviewed. No pertinent surgical history.  There were no vitals filed for this visit.  Subjective Assessment - 06/21/18 1302    Subjective  Patient reports stiff any time he sits, reports that sore the next day after PT    Currently in Pain?  No/denies    Aggravating Factors   c/o stiffness and soreness after sitting                       [facility] Adult PT Treatment/Exercise - 06/21/18 0001      Ambulation/Gait   Gait Comments  ed SPC in the clinic, very close CGA/HHA cues for form as he tends to hop and not bear weight, he is supposed to be WBAT but does not like to put weight on it.  Went over with him on stairs with a SPC and handrail one at a time      Knee/Hip Exercises: Aerobic   Nustep  level 4 x 6 minutes      Knee/Hip Exercises: Machines for Strengthening   Cybex Knee Extension  5# 3x10, then took the weight off and did left leg only cues to get to TKE, then 5# left only smaller ROM but really worked on the [employer]    Cybex Knee Flexion  25# 3x10, then left only 15# 2x10    Cybex Leg Press  20# 2x10, then left only without weight 2x10      Knee/Hip Exercises: Standing   Hip Flexion   2 sets;10 reps    Hip Flexion Limitations  left only trying to clear 10" step, this was very difficult,had to do sets of 5               PT Short Term Goals - 06/21/18 1405      PT SHORT TERM GOAL #1   Title  Ind with initial HEP    Status  Achieved        PT Long Term Goals - 06/14/18 1536      PT LONG TERM GOAL #1   Title  Pt able to ambulate community distances without AD.    Time  8    Period  Weeks    Status  New    Target Date  08/09/18      PT LONG TERM GOAL #2   Title  Pt to demo left hip WFL to complete ADLS and work functions.    Time  8    Period  Weeks    Status  New      PT LONG TERM  GOAL #3   Title  Patient able to perform ADLs with 2/10 pain or less in the left leg.    Time  8    Period  Weeks    Status  New      PT LONG TERM GOAL #4   Title  Patient to demo 5/5 left hip and knee strength to normalize ADLS.    Time  8    Period  Weeks    Status  New      PT LONG TERM GOAL #5   Title  Patient able to demonstrate good balance on LLE to prevent falls.    Time  8    Period  Weeks    Status  New            Plan - 06/21/18 1359    Clinical Impression Statement  Patient needing a lot of cues to not use his hands for transitional movements and cues to use the muscles to get on and off machines, he has pain iwth hip flexion actively.  Doing a little better with the gait on the FWW, but really difficult with SPC still    PT Next Visit Plan  progress as tolerated, try to get him walking with cane better and moving leg better    Consulted and Agree with Plan of Care  Patient       Patient will benefit from skilled therapeutic intervention in order to improve the following deficits and impairments:  Abnormal gait, Pain, Decreased mobility, Decreased range of motion, Decreased strength  Visit Diagnosis: Stiffness of left hip, not elsewhere classified  Pain in left leg  Muscle weakness (generalized)  Other abnormalities of gait and  mobility  Difficulty in walking, not elsewhere classified     Problem List Patient Active Problem List   Diagnosis Date Noted  . Hyperlipemia   . Paresthesia     Sumner Boast., PT  06/21/2018, 2:06 PM  [address] [place], [place], [address] Phone: [phone]   Fax:  [phone]  Name: Peter Stevens MRN: [id number] Date of Birth: 12-08-1960

## 2018-06-23 ENCOUNTER — Encounter: Payer: Self-pay | Admitting: Physical Therapy

## 2018-06-23 ENCOUNTER — Ambulatory Visit: Payer: BLUE CROSS/BLUE SHIELD | Admitting: Physical Therapy

## 2018-06-23 DIAGNOSIS — R262 Difficulty in walking, not elsewhere classified: Secondary | ICD-10-CM | POA: Diagnosis not present

## 2018-06-23 DIAGNOSIS — M25652 Stiffness of left hip, not elsewhere classified: Secondary | ICD-10-CM | POA: Diagnosis not present

## 2018-06-23 DIAGNOSIS — R2689 Other abnormalities of gait and mobility: Secondary | ICD-10-CM | POA: Diagnosis not present

## 2018-06-23 DIAGNOSIS — M79605 Pain in left leg: Secondary | ICD-10-CM

## 2018-06-23 DIAGNOSIS — M6281 Muscle weakness (generalized): Secondary | ICD-10-CM

## 2018-06-23 NOTE — Therapy (Signed)
[place] [address] [place], [place], [address] Phone: [phone]   Fax:  [phone]  Physical Therapy Treatment  Patient Details  Name: Peter Stevens MRN: [id number] Date of Birth: [birth year] Referring Provider (PT): Ainsley Spinner PA-C; Altamese [place] MD   Encounter Date: 06/23/2018  PT End of Session - 06/23/18 1649    Visit Number  4    Date for PT Re-Evaluation  08/09/18    PT Start Time  1600    PT Stop Time  1645    PT Time Calculation (min)  45 min    Activity Tolerance  Patient tolerated treatment well;Patient limited by pain    Behavior During Therapy  [facility] for tasks assessed/performed       Past Medical History:  Diagnosis Date  . Herpes simplex   . Hyperlipemia   . Paresthesia     History reviewed. No pertinent surgical history.  There were no vitals filed for this visit.  Subjective Assessment - 06/23/18 1600    Subjective  "Doing pretty good"     Currently in Pain?  Yes    Pain Score  2     Pain Location  Leg    Pain Orientation  Left;Upper                       [facility] Adult PT Treatment/Exercise - 06/23/18 0001      Knee/Hip Exercises: Aerobic   Nustep  level 4 x 6 minutes      Knee/Hip Exercises: Machines for Strengthening   Cybex Knee Extension  5lb LLE 2x5, 10lb eccentrics for LLE 2x5     Cybex Knee Flexion  25# 3x10, then left only 15# 2x10    Cybex Leg Press  20# 2x10, then left only without weight 2x10      Knee/Hip Exercises: Standing   Other Standing Knee Exercises  Standing march with RW RUE only 2x10, Standin wt shifts with feet elevation x5 each side     Other Standing Knee Exercises  ball aqueezes 2x10 3 sec hold      Knee/Hip Exercises: Seated   Sit to Sand  2 sets;10 reps;without UE support   therapist pulls pt slightly to his L to distribe wt               PT Short Term Goals - 06/21/18 1405      PT SHORT TERM GOAL #1   Title  Ind  with initial HEP    Status  Achieved        PT Long Term Goals - 06/14/18 1536      PT LONG TERM GOAL #1   Title  Pt able to ambulate community distances without AD.    Time  8    Period  Weeks    Status  New    Target Date  08/09/18      PT LONG TERM GOAL #2   Title  Pt to demo left hip WFL to complete ADLS and work functions.    Time  8    Period  Weeks    Status  New      PT LONG TERM GOAL #3   Title  Patient able to perform ADLs with 2/10 pain or less in the left leg.    Time  8    Period  Weeks    Status  New      PT LONG TERM  GOAL #4   Title  Patient to demo 5/5 left hip and knee strength to normalize ADLS.    Time  8    Period  Weeks    Status  New      PT LONG TERM GOAL #5   Title  Patient able to demonstrate good balance on LLE to prevent falls.    Time  8    Period  Weeks    Status  New            Plan - 06/23/18 1650    Clinical Impression Statement  Good carryover from previous treatment with resisted exercises. Progressed to some eccentric L quad strengthening with machine, visible shaking noted. Pt continues to be fearful ambulating with SPC, decrease stance time on LLE. CGA assist needed for wt shifts, pt very nervous to  put his full weight on LLE.    Rehab Potential  Excellent    PT Frequency  2x / week    PT Duration  8 weeks    PT Treatment/Interventions  ADLs/Self Care Home Management;Cryotherapy;Electrical Stimulation;Moist Heat;Therapeutic exercise;Balance training;Neuromuscular re-education;Stair training;Gait training;Patient/family education;Manual techniques;Vasopneumatic Device    PT Next Visit Plan  progress as tolerated, try to get him walking with cane better and moving leg better       Patient will benefit from skilled therapeutic intervention in order to improve the following deficits and impairments:  Abnormal gait, Pain, Decreased mobility, Decreased range of motion, Decreased strength  Visit Diagnosis: Stiffness of left  hip, not elsewhere classified  Pain in left leg  Muscle weakness (generalized)  Difficulty in walking, not elsewhere classified  Other abnormalities of gait and mobility     Problem List Patient Active Problem List   Diagnosis Date Noted  . Hyperlipemia   . Paresthesia     Scot Jun, PTA 06/23/2018, 4:58 PM  [address] [place], [place], [address] Phone: [phone]   Fax:  [phone]  Name: Peter Stevens MRN: [id number] Date of Birth: [birth year]

## 2018-06-27 ENCOUNTER — Encounter: Payer: Self-pay | Admitting: Physical Therapy

## 2018-06-27 ENCOUNTER — Ambulatory Visit: Payer: BLUE CROSS/BLUE SHIELD | Admitting: Physical Therapy

## 2018-06-27 DIAGNOSIS — M25652 Stiffness of left hip, not elsewhere classified: Secondary | ICD-10-CM

## 2018-06-27 DIAGNOSIS — M79605 Pain in left leg: Secondary | ICD-10-CM

## 2018-06-27 DIAGNOSIS — M6281 Muscle weakness (generalized): Secondary | ICD-10-CM

## 2018-06-27 DIAGNOSIS — R262 Difficulty in walking, not elsewhere classified: Secondary | ICD-10-CM | POA: Diagnosis not present

## 2018-06-27 DIAGNOSIS — R2689 Other abnormalities of gait and mobility: Secondary | ICD-10-CM | POA: Diagnosis not present

## 2018-06-27 NOTE — Therapy (Signed)
[place] [address] [place], [place], [address] Phone: [phone]   Fax:  [phone]  Physical Therapy Treatment  Patient Details  Name: Peter Stevens MRN: [id number] Date of Birth: Nov 02, 1960 Referring Provider (PT): Ainsley Spinner PA-C; Altamese [place] MD   Encounter Date: 06/27/2018  PT End of Session - 06/27/18 1557    Visit Number  5    Date for PT Re-Evaluation  08/09/18    PT Start Time  3154    PT Stop Time  1600    PT Time Calculation (min)  45 min    Activity Tolerance  Patient tolerated treatment well;Patient limited by pain    Behavior During Therapy  [facility] for tasks assessed/performed       Past Medical History:  Diagnosis Date  . Herpes simplex   . Hyperlipemia   . Paresthesia     History reviewed. No pertinent surgical history.  There were no vitals filed for this visit.  Subjective Assessment - 06/27/18 1517    Subjective  "Going good, lots of pain"    Currently in Pain?  Yes    Pain Score  4     Pain Location  Leg    Pain Orientation  Left;Upper                       [facility] Adult PT Treatment/Exercise - 06/27/18 0001      Knee/Hip Exercises: Aerobic   Nustep  level 2 x 6 minutes LE only      Knee/Hip Exercises: Machines for Strengthening   Cybex Knee Extension  5lb LLE x10, 10lb eccentrics for LLE 2x10     Cybex Knee Flexion  35# 2x10, then left only 20# 2x10    Cybex Leg Press  30# 3x10, then left only TKE 20lb x5      Knee/Hip Exercises: Standing   Other Standing Knee Exercises  Standing wt shift  3 x5 having to lift RLE      Knee/Hip Exercises: Seated   Other Seated Knee/Hip Exercises  LLE ankle pumps green tband 2x20               PT Short Term Goals - 06/21/18 1405      PT SHORT TERM GOAL #1   Title  Ind with initial HEP    Status  Achieved        PT Long Term Goals - 06/14/18 1536      PT LONG TERM GOAL #1   Title  Pt able to ambulate  community distances without AD.    Time  8    Period  Weeks    Status  New    Target Date  08/09/18      PT LONG TERM GOAL #2   Title  Pt to demo left hip WFL to complete ADLS and work functions.    Time  8    Period  Weeks    Status  New      PT LONG TERM GOAL #3   Title  Patient able to perform ADLs with 2/10 pain or less in the left leg.    Time  8    Period  Weeks    Status  New      PT LONG TERM GOAL #4   Title  Patient to demo 5/5 left hip and knee strength to normalize ADLS.    Time  8    Period  Weeks    Status  New      PT LONG TERM GOAL #5   Title  Patient able to demonstrate good balance on LLE to prevent falls.    Time  8    Period  Weeks    Status  New            Plan - 06/27/18 1557    Clinical Impression Statement  Pt with better confidence wt shifting and lifting LLE to bear wright in LLE, but he does have some mis thigh pain.. Progressed to resisted SL TKE again putting pressure through LLE. No issue with NuStep warm up using LE only. Pt tolerated increase load with leg curls and extensions.    Rehab Potential  Excellent    PT Frequency  2x / week    PT Treatment/Interventions  ADLs/Self Care Home Management;Cryotherapy;Electrical Stimulation;Moist Heat;Therapeutic exercise;Balance training;Neuromuscular re-education;Stair training;Gait training;Patient/family education;Manual techniques;Vasopneumatic Device    PT Next Visit Plan  progress as tolerated, try to get him walking with cane better and moving leg better       Patient will benefit from skilled therapeutic intervention in order to improve the following deficits and impairments:  Abnormal gait, Pain, Decreased mobility, Decreased range of motion, Decreased strength  Visit Diagnosis: Stiffness of left hip, not elsewhere classified  Pain in left leg  Muscle weakness (generalized)     Problem List Patient Active Problem List   Diagnosis Date Noted  . Hyperlipemia   . Paresthesia      Scot Jun, PTA 06/27/2018, 4:02 PM  [address] [place], [place], [address] Phone: [phone]   Fax:  [phone]  Name: Peter Stevens MRN: [id number] Date of Birth: 03-26-1961

## 2018-06-28 DIAGNOSIS — W19XXXA Unspecified fall, initial encounter: Secondary | ICD-10-CM | POA: Diagnosis not present

## 2018-06-28 DIAGNOSIS — T1490XA Injury, unspecified, initial encounter: Secondary | ICD-10-CM | POA: Diagnosis not present

## 2018-06-30 ENCOUNTER — Ambulatory Visit: Payer: BLUE CROSS/BLUE SHIELD | Admitting: Physical Therapy

## 2018-06-30 DIAGNOSIS — M25652 Stiffness of left hip, not elsewhere classified: Secondary | ICD-10-CM

## 2018-06-30 DIAGNOSIS — R262 Difficulty in walking, not elsewhere classified: Secondary | ICD-10-CM | POA: Diagnosis not present

## 2018-06-30 DIAGNOSIS — M79605 Pain in left leg: Secondary | ICD-10-CM | POA: Diagnosis not present

## 2018-06-30 DIAGNOSIS — M6281 Muscle weakness (generalized): Secondary | ICD-10-CM

## 2018-06-30 DIAGNOSIS — R2689 Other abnormalities of gait and mobility: Secondary | ICD-10-CM | POA: Diagnosis not present

## 2018-06-30 NOTE — Therapy (Signed)
[place] [address] [address], [place], [address] Phone: [phone]   Fax:  [phone]  Physical Therapy Treatment  Patient Details  Name: Peter Stevens MRN: [id number] Date of Birth: 11-29-1960 Referring Provider (PT): Ainsley Spinner PA-C; Altamese [place] MD   Encounter Date: 06/30/2018  PT End of Session - 06/30/18 1109    Visit Number  6    Date for PT Re-Evaluation  08/09/18    PT Start Time  1105    PT Stop Time  1148    PT Time Calculation (min)  43 min    Activity Tolerance  Patient tolerated treatment well    Behavior During Therapy  [facility] for tasks assessed/performed       Past Medical History:  Diagnosis Date  . Herpes simplex   . Hyperlipemia   . Paresthesia     No past surgical history on file.  There were no vitals filed for this visit.  Subjective Assessment - 06/30/18 1109    Subjective  "Each day it seems like there is a little more I can do".   He reports he is water walking 2x/wk.     Patient Stated Goals  get back to walking normally    Currently in Pain?  Yes    Pain Score  4     Pain Location  Leg    Pain Orientation  Left;Upper    Pain Descriptors / Indicators  Tightness    Aggravating Factors   Weight bearing     Pain Relieving Factors  non-weight bearing, rest, ice         [facility] PT Assessment - 06/30/18 0001      Assessment   Medical Diagnosis  left femur fracture    Referring Provider (PT)  Ainsley Spinner PA-C; Altamese [place] MD    Onset Date/Surgical Date  05/24/18    Next MD Visit  07/08/18    Prior Therapy  3 [facility]PT       [facility] Adult PT Treatment/Exercise - 06/30/18 0001      Knee/Hip Exercises: Stretches   Passive Hamstring Stretch  Left;2 reps;30 seconds   seated with straight back   Quad Stretch  Left;2 reps;30 seconds   seated with foot under step   Other Knee/Hip Stretches  trial of seated Lt piriformis stretch (very difficult and painful);  changed to modified  pigeon pose in sitting (RLE on ground, LLE in ER on table) x 15 sec - improved tolerance      Knee/Hip Exercises: Aerobic   Nustep  level 4 x 6 minutes LE only      Knee/Hip Exercises: Standing   Heel Raises  10 reps   and toe raises   Forward Step Up  Left;Step Height: 4";3 sets;5 reps;Hand Hold: 2   Lt hand holding pole, Rt HHA   Forward Step Up Limitations  VC for controlled motion    SLS  Rt SLS x 15 reps;  weight shifts to Lt SLS, then SLS with unilateral UE support on window ledge x 10 seconds 3 reps    Gait Training  40 ft with RW x 2 reps, VC for posture, increased Lt knee flexion duirng swing through, and rolling walker continuously;  gait with SPC x 40 ft x 3 reps with cues for posture and SPC placement.        Knee/Hip Exercises: Seated   Sit to Sand  10 reps;without UE support;2 sets   Rt  foot forward, cues for eccentric lowering        PT Short Term Goals - 06/21/18 1405      PT SHORT TERM GOAL #1   Title  Ind with initial HEP    Status  Achieved        PT Long Term Goals - 06/14/18 1536      PT LONG TERM GOAL #1   Title  Pt able to ambulate community distances without AD.    Time  8    Period  Weeks    Status  New    Target Date  08/09/18      PT LONG TERM GOAL #2   Title  Pt to demo left hip WFL to complete ADLS and work functions.    Time  8    Period  Weeks    Status  New      PT LONG TERM GOAL #3   Title  Patient able to perform ADLs with 2/10 pain or less in the left leg.    Time  8    Period  Weeks    Status  New      PT LONG TERM GOAL #4   Title  Patient to demo 5/5 left hip and knee strength to normalize ADLS.    Time  8    Period  Weeks    Status  New      PT LONG TERM GOAL #5   Title  Patient able to demonstrate good balance on LLE to prevent falls.    Time  8    Period  Weeks    Status  New            Plan - 06/30/18 1617    Clinical Impression Statement  With encouragement, pt was able to complete SLS on LLE with  unilateral support on window ledge for 10 seconds, and was able to complete 5 reps of forward step ups.  Pt reported slight decrease in LLE pain at end of session.  Progressing gradually towards therapy goals.  Pt will benefit from continued PT intervention to max functional mobility.    Rehab Potential  Excellent    PT Frequency  2x / week    PT Duration  8 weeks    PT Treatment/Interventions  ADLs/Self Care Home Management;Cryotherapy;Electrical Stimulation;Moist Heat;Therapeutic exercise;Balance training;Neuromuscular re-education;Stair training;Gait training;Patient/family education;Manual techniques;Vasopneumatic Device    PT Next Visit Plan  continue gait training, progressive LE functional strengthening, and gradual increases in single leg exercises.     PT Home Exercise Plan  [email or web address]    Consulted and Agree with Plan of Care  Patient       Patient will benefit from skilled therapeutic intervention in order to improve the following deficits and impairments:  Abnormal gait, Pain, Decreased mobility, Decreased range of motion, Decreased strength  Visit Diagnosis: Stiffness of left hip, not elsewhere classified  Pain in left leg  Muscle weakness (generalized)  Difficulty in walking, not elsewhere classified     Problem List Patient Active Problem List   Diagnosis Date Noted  . Hyperlipemia   . Paresthesia    Kerin Perna, PTA 06/30/18 4:33 PM  [place] [address] [place], [place], [address] Phone: [phone]   Fax:  [phone]  Name: Peter Stevens MRN: [id number] Date of Birth: 1960/07/18

## 2018-07-04 ENCOUNTER — Ambulatory Visit: Payer: BLUE CROSS/BLUE SHIELD | Admitting: Physical Therapy

## 2018-07-04 ENCOUNTER — Encounter: Payer: Self-pay | Admitting: Physical Therapy

## 2018-07-04 DIAGNOSIS — M79605 Pain in left leg: Secondary | ICD-10-CM | POA: Diagnosis not present

## 2018-07-04 DIAGNOSIS — M25652 Stiffness of left hip, not elsewhere classified: Secondary | ICD-10-CM

## 2018-07-04 DIAGNOSIS — R262 Difficulty in walking, not elsewhere classified: Secondary | ICD-10-CM

## 2018-07-04 DIAGNOSIS — M6281 Muscle weakness (generalized): Secondary | ICD-10-CM | POA: Diagnosis not present

## 2018-07-04 DIAGNOSIS — R2689 Other abnormalities of gait and mobility: Secondary | ICD-10-CM | POA: Diagnosis not present

## 2018-07-04 NOTE — Therapy (Signed)
[place], [place], [address] Phone: [phone]   Fax:  [phone]  Physical Therapy Treatment  Patient Details  Name: Peter Stevens MRN: [id number] Date of Birth: 25-Jun-1960 Referring Provider (PT): Ainsley Spinner PA-C; Altamese [place] MD   Encounter Date: 07/04/2018  PT End of Session - 07/04/18 1559    Visit Number  7    Date for PT Re-Evaluation  08/09/18    PT Start Time  1517    PT Stop Time  1559    PT Time Calculation (min)  44 min    Activity Tolerance  Patient tolerated treatment well    Behavior During Therapy  [facility] for tasks assessed/performed       Past Medical History:  Diagnosis Date  . Herpes simplex   . Hyperlipemia   . Paresthesia     History reviewed. No pertinent surgical history.  There were no vitals filed for this visit.  Subjective Assessment - 07/04/18 1518    Subjective  Pt reports that he has been swimming. "Feeling good" pt brought in axillary crunches for stair negotiation    Currently in Pain?  Yes    Pain Score  2     Pain Location  Leg    Pain Orientation  Left;Upper                       [facility] Adult PT Treatment/Exercise - 07/04/18 0001      Ambulation/Gait   Ambulation/Gait  Yes    Ambulation/Gait Assistance  4: Min guard    Ambulation Distance (Feet)  60 Feet   x3   Assistive device  None    Gait Pattern  Decreased stance time - left    Ambulation Surface  Level;Indoor    Stairs  Yes    Stairs Assistance  6: Modified independent (Device/Increase time)    Stair Management Technique  With crutches;No rails;Step to pattern;Forwards    Number of Stairs  12    Height of Stairs  6      Knee/Hip Exercises: Stretches   Other Knee/Hip Stretches  modified pigeon pose in sitting (RLE on ground, LLE in ER on table) x 15 sec - improved tolerance      Knee/Hip Exercises: Aerobic   Recumbent Bike  L3 x 4 min     Nustep  level 4 x 6  minutes LE only      Knee/Hip Exercises: Machines for Strengthening   Cybex Knee Extension  5lb LLE x15, 15lb eccentrics for LLE x10     Cybex Knee Flexion  left only 20# 2x15      Knee/Hip Exercises: Standing   Heel Raises  Both;2 sets;15 reps;2 seconds    Forward Step Up  Left;Step Height: 4";Hand Hold: 1;10 reps;2 sets    Forward Step Up Limitations  VC for controlled motion               PT Short Term Goals - 06/21/18 1405      PT SHORT TERM GOAL #1   Title  Ind with initial HEP    Status  Achieved        PT Long Term Goals - 07/04/18 1559      PT LONG TERM GOAL #1   Title  Pt able to ambulate community distances without AD.    Status  On-going      PT LONG TERM GOAL #2  Title  Pt to demo left hip WFL to complete ADLS and work functions.    Status  On-going      PT LONG TERM GOAL #3   Title  Patient able to perform ADLs with 2/10 pain or less in the left leg.    Status  On-going      PT LONG TERM GOAL #4   Title  Patient to demo 5/5 left hip and knee strength to normalize ADLS.    Status  On-going      PT LONG TERM GOAL #5   Title  Patient able to demonstrate good balance on LLE to prevent falls.    Status  On-going            Plan - 07/04/18 1600    Clinical Impression Statement  Pt has progressed to ambulating without AD. Pt required cues to flex L knee and hip. Antalgic gait decrease stance time on LLE. Pt did really well with stair negotiation with bilat crunches. Pt with some compensation noted with 4 inch step up.     Rehab Potential  Excellent    PT Frequency  2x / week    PT Duration  8 weeks    PT Treatment/Interventions  ADLs/Self Care Home Management;Cryotherapy;Electrical Stimulation;Moist Heat;Therapeutic exercise;Balance training;Neuromuscular re-education;Stair training;Gait training;Patient/family education;Manual techniques;Vasopneumatic Device    PT Next Visit Plan  continue gait training, progressive LE functional strengthening,  and gradual increases in single leg exercises.        Patient will benefit from skilled therapeutic intervention in order to improve the following deficits and impairments:  Abnormal gait, Pain, Decreased mobility, Decreased range of motion, Decreased strength  Visit Diagnosis: Stiffness of left hip, not elsewhere classified  Pain in left leg  Muscle weakness (generalized)  Difficulty in walking, not elsewhere classified     Problem List Patient Active Problem List   Diagnosis Date Noted  . Hyperlipemia   . Paresthesia     Scot Jun, PTA 07/04/2018, 4:04 PM  [place] [address] [address] [place], [place], [address] Phone: [phone]   Fax:  [phone]  Name: Peter Stevens MRN: [id number] Date of Birth: Sep 17, 1960

## 2018-07-06 DIAGNOSIS — S72102D Unspecified trochanteric fracture of left femur, subsequent encounter for closed fracture with routine healing: Secondary | ICD-10-CM | POA: Diagnosis not present

## 2018-07-06 DIAGNOSIS — S72302D Unspecified fracture of shaft of left femur, subsequent encounter for closed fracture with routine healing: Secondary | ICD-10-CM | POA: Diagnosis not present

## 2018-07-07 ENCOUNTER — Encounter: Payer: Self-pay | Admitting: Physical Therapy

## 2018-07-07 ENCOUNTER — Ambulatory Visit: Payer: BLUE CROSS/BLUE SHIELD | Admitting: Physical Therapy

## 2018-07-07 DIAGNOSIS — M6281 Muscle weakness (generalized): Secondary | ICD-10-CM

## 2018-07-07 DIAGNOSIS — R262 Difficulty in walking, not elsewhere classified: Secondary | ICD-10-CM | POA: Diagnosis not present

## 2018-07-07 DIAGNOSIS — M79605 Pain in left leg: Secondary | ICD-10-CM

## 2018-07-07 DIAGNOSIS — M25652 Stiffness of left hip, not elsewhere classified: Secondary | ICD-10-CM

## 2018-07-07 DIAGNOSIS — R2689 Other abnormalities of gait and mobility: Secondary | ICD-10-CM | POA: Diagnosis not present

## 2018-07-07 NOTE — Therapy (Signed)
[place] [address] [place], [place], [address] Phone: [phone]   Fax:  [phone]  Physical Therapy Treatment  Patient Details  Name: Peter Stevens MRN: [id number] Date of Birth: 23-Apr-1961 Referring Provider (PT): Ainsley Spinner PA-C; Altamese [place] MD   Encounter Date: 07/07/2018  PT End of Session - 07/07/18 1648    Visit Number  8    Date for PT Re-Evaluation  08/09/18    PT Start Time  1600    PT Stop Time  1648    PT Time Calculation (min)  48 min       Past Medical History:  Diagnosis Date  . Herpes simplex   . Hyperlipemia   . Paresthesia     History reviewed. No pertinent surgical history.  There were no vitals filed for this visit.  Subjective Assessment - 07/07/18 1601    Subjective  "Hanging in their"    Currently in Pain?  Yes    Pain Score  2     Pain Location  Leg    Pain Orientation  Left;Upper                       [facility] Adult PT Treatment/Exercise - 07/07/18 0001      Ambulation/Gait   Ambulation/Gait  Yes    Ambulation/Gait Assistance  4: Min guard    Ambulation Distance (Feet)  70 Feet    Assistive device  None    Gait Pattern  Decreased stance time - left    Ambulation Surface  Level;Indoor      Knee/Hip Exercises: Stretches   Other Knee/Hip Stretches  modified pigeon pose in sitting (RLE on ground, LLE in ER on table) x 15 sec - improved tolerance      Knee/Hip Exercises: Aerobic   Recumbent Bike  L3 x 5 min     Nustep  level 5 x 4 minutes LE only      Knee/Hip Exercises: Machines for Strengthening   Cybex Knee Extension  10lb LLE 2x10, 15lb eccentrics for LLE x10     Cybex Knee Flexion  35lb2x15, 20lb 2x10     Cybex Leg Press  40# 2x15, then left only TKE 20lb 3x5; LLE heel raises 20lb 2x15       Knee/Hip Exercises: Standing   Forward Step Up  Left;Step Height: 4";10 reps;2 sets;Hand Hold: 0    Forward Step Up Limitations  VC for controlled  motion      Knee/Hip Exercises: Seated   Other Seated Knee/Hip Exercises  Practice getting up from floor with 1 crutch x3               PT Short Term Goals - 06/21/18 1405      PT SHORT TERM GOAL #1   Title  Ind with initial HEP    Status  Achieved        PT Long Term Goals - 07/07/18 1650      PT LONG TERM GOAL #1   Title  Pt able to ambulate community distances without AD.    Status  On-going      PT LONG TERM GOAL #2   Title  Pt to demo left hip WFL to complete ADLS and work functions.    Status  On-going            Plan - 07/07/18 1650    Clinical Impression Statement  Pt continues to progress ambulation  without AD. Pt able to perform a four inch step up without HHA, but with a little compensation. Increase load tolerated with seated leg curls and extension. Improved strength with SL TKE.     Rehab Potential  Excellent    PT Frequency  2x / week    PT Duration  8 weeks    PT Treatment/Interventions  ADLs/Self Care Home Management;Cryotherapy;Electrical Stimulation;Moist Heat;Therapeutic exercise;Balance training;Neuromuscular re-education;Stair training;Gait training;Patient/family education;Manual techniques;Vasopneumatic Device    PT Next Visit Plan  continue gait training, progressive LE functional strengthening, and gradual increases in single leg exercises.        Patient will benefit from skilled therapeutic intervention in order to improve the following deficits and impairments:  Abnormal gait, Pain, Decreased mobility, Decreased range of motion, Decreased strength  Visit Diagnosis: Stiffness of left hip, not elsewhere classified  Pain in left leg  Muscle weakness (generalized)  Difficulty in walking, not elsewhere classified     Problem List Patient Active Problem List   Diagnosis Date Noted  . Hyperlipemia   . Paresthesia     Scot Jun, PTA 07/07/2018, 4:54 PM  [place] [address] [place], [place], [address] Phone: [phone]   Fax:  [phone]  Name: Peter Stevens MRN: [id number] Date of Birth: [birth year]

## 2018-07-18 ENCOUNTER — Ambulatory Visit: Payer: BLUE CROSS/BLUE SHIELD | Attending: Orthopedic Surgery | Admitting: Physical Therapy

## 2018-07-18 ENCOUNTER — Encounter: Payer: Self-pay | Admitting: Physical Therapy

## 2018-07-18 DIAGNOSIS — R262 Difficulty in walking, not elsewhere classified: Secondary | ICD-10-CM

## 2018-07-18 DIAGNOSIS — M6281 Muscle weakness (generalized): Secondary | ICD-10-CM

## 2018-07-18 DIAGNOSIS — M79605 Pain in left leg: Secondary | ICD-10-CM | POA: Insufficient documentation

## 2018-07-18 DIAGNOSIS — M25652 Stiffness of left hip, not elsewhere classified: Secondary | ICD-10-CM | POA: Insufficient documentation

## 2018-07-18 NOTE — Therapy (Signed)
[place], [place], [address] Phone: [phone]   Fax:  [phone]  Physical Therapy Treatment  Patient Details  Name: Peter Stevens MRN: [id number] Date of Birth: [birth year] Referring Provider (PT): Ainsley Spinner PA-C; Altamese [place] MD   Encounter Date: 07/18/2018  PT End of Session - 07/18/18 1653    Visit Number  9    Date for PT Re-Evaluation  08/09/18    PT Start Time  1530    PT Stop Time  1610    PT Time Calculation (min)  40 min    Activity Tolerance  Patient tolerated treatment well    Behavior During Therapy  [facility] for tasks assessed/performed       Past Medical History:  Diagnosis Date  . Herpes simplex   . Hyperlipemia   . Paresthesia     History reviewed. No pertinent surgical history.  There were no vitals filed for this visit.  Subjective Assessment - 07/18/18 1536    Subjective  I was doing pretty good, reports that he was on a boat ride and got jarred pretty good, reports "set me back a little"    Currently in Pain?  Yes    Pain Score  3     Pain Location  Leg    Pain Orientation  Right;Upper    Pain Descriptors / Indicators  Aching    Aggravating Factors   activity    Pain Relieving Factors  stopped taking pain meds                       [facility] Adult PT Treatment/Exercise - 07/18/18 0001      Ambulation/Gait   Gait Comments  SPC x 100 feet with cues to decrease the limp      Knee/Hip Exercises: Aerobic   Recumbent Bike  L3 x 5 min     Nustep  level 5 x 5 minutes LE only    Other Aerobic  resisted gait all directions with an extra person for gaurding      Knee/Hip Exercises: Machines for Strengthening   Cybex Knee Extension  10# left only 3x10    Cybex Knee Flexion  left only 2x10 20#    Cybex Leg Press  left only 20# 2x10      Knee/Hip Exercises: Standing   Other Standing Knee Exercises  soccer in hall working on weight shift lateral  movements               PT Short Term Goals - 06/21/18 1405      PT SHORT TERM GOAL #1   Title  Ind with initial HEP    Status  Achieved        PT Long Term Goals - 07/18/18 1655      PT LONG TERM GOAL #1   Title  Pt able to ambulate community distances without AD.    Status  On-going      PT LONG TERM GOAL #2   Title  Pt to demo left hip WFL to complete ADLS and work functions.    Status  On-going      PT LONG TERM GOAL #3   Title  Patient able to perform ADLs with 2/10 pain or less in the left leg.    Status  On-going            Plan - 07/18/18 1653    Clinical Impression Statement  Patient having a little pain i the mid thigh, he reports that he was doing great until the boat ride.  He did well with the activities today but had some c/o pain, he had the most difficulty with standing on the left leg and kicking with the right if it caused some rotation of the hips    PT Next Visit Plan  continue gait training, progressive LE functional strengthening, and gradual increases in single leg exercises.     Consulted and Agree with Plan of Care  Patient       Patient will benefit from skilled therapeutic intervention in order to improve the following deficits and impairments:  Abnormal gait, Pain, Decreased mobility, Decreased range of motion, Decreased strength  Visit Diagnosis: Stiffness of left hip, not elsewhere classified  Pain in left leg  Muscle weakness (generalized)  Difficulty in walking, not elsewhere classified     Problem List Patient Active Problem List   Diagnosis Date Noted  . Hyperlipemia   . Paresthesia     Sumner Boast., PT 07/18/2018, 4:55 PM  [place], [place], [address] Phone: [phone]   Fax:  [phone]  Name: DOLAN XIA MRN: [id number] Date of Birth: 1960-08-26

## 2018-07-21 ENCOUNTER — Ambulatory Visit: Payer: BLUE CROSS/BLUE SHIELD | Admitting: Physical Therapy

## 2018-07-21 ENCOUNTER — Other Ambulatory Visit: Payer: Self-pay

## 2018-07-21 DIAGNOSIS — M6281 Muscle weakness (generalized): Secondary | ICD-10-CM | POA: Diagnosis not present

## 2018-07-21 DIAGNOSIS — M79605 Pain in left leg: Secondary | ICD-10-CM

## 2018-07-21 DIAGNOSIS — M25652 Stiffness of left hip, not elsewhere classified: Secondary | ICD-10-CM | POA: Diagnosis not present

## 2018-07-21 DIAGNOSIS — R262 Difficulty in walking, not elsewhere classified: Secondary | ICD-10-CM | POA: Diagnosis not present

## 2018-07-21 NOTE — Therapy (Signed)
[place], [place], [address] Phone: [phone]   Fax:  [phone] Progress Note Reporting Period 06/14/18 to 07/21/18 for the first 10 visits  See note below for Objective Data and Assessment of Progress/Goals.      Physical Therapy Treatment  Patient Details  Name: Peter Stevens MRN: [id number] Date of Birth: 01/31/1961 Referring Provider (PT): Ainsley Spinner PA-C; Altamese [place] MD   Encounter Date: 07/21/2018  PT End of Session - 07/21/18 1708    Visit Number  10    Date for PT Re-Evaluation  08/09/18    PT Start Time  1615    PT Stop Time  1705    PT Time Calculation (min)  50 min       Past Medical History:  Diagnosis Date  . Herpes simplex   . Hyperlipemia   . Paresthesia     No past surgical history on file.  There were no vitals filed for this visit.  Subjective Assessment - 07/21/18 1619    Subjective  sore in left quad    Currently in Pain?  Yes    Pain Score  3     Pain Location  Leg                       [facility] Adult PT Treatment/Exercise - 07/21/18 0001      Knee/Hip Exercises: Aerobic   Nustep  L 6 6 min LE only      Knee/Hip Exercises: Machines for Strengthening   Cybex Knee Extension  10# left only 3x10    Cybex Knee Flexion  left only 3x10 20#    Cybex Leg Press  left only 20# 3x10   slide # 8     Knee/Hip Exercises: Standing   Lateral Step Up  Left;1 set;Hand Hold: 2;10 reps   BOSU   Forward Step Up  Left;1 set;Hand Hold: 2;10 reps   BOSU   Other Standing Knee Exercises  BOSU func squat and hele raises 15 each    Other Standing Knee Exercises  red tband hip 3 way , 2 HHA, 15 each      Knee/Hip Exercises: Supine   Bridges  Strengthening;Both;1 set;10 reps   feet on ball, 2nd set HS curl while in bridge   Single Leg Bridge  Left;Strengthening;10 reps    Straight Leg Raises  Strengthening;Left;2 sets;10 reps   red tband     Manual  Therapy   Manual Therapy  Soft tissue mobilization    Soft tissue mobilization  left thigh with cupping to loosen muscles               PT Short Term Goals - 06/21/18 1405      PT SHORT TERM GOAL #1   Title  Ind with initial HEP    Status  Achieved        PT Long Term Goals - 07/18/18 1655      PT LONG TERM GOAL #1   Title  Pt able to ambulate community distances without AD.    Status  On-going      PT LONG TERM GOAL #2   Title  Pt to demo left hip WFL to complete ADLS and work functions.    Status  On-going      PT LONG TERM GOAL #3   Title  Patient able to perform ADLs with 2/10 pain or less in the left  leg.    Status  On-going            Plan - 07/21/18 1708    Clinical Impression Statement  added cupping with STW to left thigh to relax muscle- some pain relief and walking better after. added reps to w machines today. added hip strengthening and pt fatigued easily and pain with SLS on left esp after relaxing muscle.    PT Treatment/Interventions  ADLs/Self Care Home Management;Cryotherapy;Electrical Stimulation;Moist Heat;Therapeutic exercise;Balance training;Neuromuscular re-education;Stair training;Gait training;Patient/family education;Manual techniques;Vasopneumatic Device    PT Next Visit Plan  continue gait training, progressive LE functional strengthening, and gradual increases in single leg exercises.        Patient will benefit from skilled therapeutic intervention in order to improve the following deficits and impairments:  Abnormal gait, Pain, Decreased mobility, Decreased range of motion, Decreased strength  Visit Diagnosis: Stiffness of left hip, not elsewhere classified  Pain in left leg  Muscle weakness (generalized)     Problem List Patient Active Problem List   Diagnosis Date Noted  . Hyperlipemia   . Paresthesia     PAYSEUR,ANGIE PTA 07/21/2018, 5:10 PM  [place], [place], [address] Phone: [phone]   Fax:  [phone]  Name: Peter Stevens MRN: [id number] Date of Birth: 11-Oct-1960

## 2018-07-25 ENCOUNTER — Encounter: Payer: Self-pay | Admitting: Physical Therapy

## 2018-07-25 ENCOUNTER — Ambulatory Visit: Payer: BLUE CROSS/BLUE SHIELD | Admitting: Physical Therapy

## 2018-07-25 ENCOUNTER — Other Ambulatory Visit: Payer: Self-pay

## 2018-07-25 DIAGNOSIS — M79605 Pain in left leg: Secondary | ICD-10-CM

## 2018-07-25 DIAGNOSIS — M25652 Stiffness of left hip, not elsewhere classified: Secondary | ICD-10-CM

## 2018-07-25 DIAGNOSIS — M6281 Muscle weakness (generalized): Secondary | ICD-10-CM

## 2018-07-25 DIAGNOSIS — R262 Difficulty in walking, not elsewhere classified: Secondary | ICD-10-CM | POA: Diagnosis not present

## 2018-07-25 NOTE — Therapy (Signed)
[place], [place], [address] Phone: [phone]   Fax:  [phone]  Physical Therapy Treatment  Patient Details  Name: Peter Stevens MRN: [id number] Date of Birth: 27-Mar-1961 Referring Provider (PT): Ainsley Spinner PA-C; Altamese [place] MD   Encounter Date: 07/25/2018  PT End of Session - 07/25/18 1720    Visit Number  11    Date for PT Re-Evaluation  08/09/18    PT Start Time  9924    PT Stop Time  1610    PT Time Calculation (min)  47 min    Activity Tolerance  Patient tolerated treatment well    Behavior During Therapy  [facility] for tasks assessed/performed       Past Medical History:  Diagnosis Date  . Herpes simplex   . Hyperlipemia   . Paresthesia     History reviewed. No pertinent surgical history.  There were no vitals filed for this visit.  Subjective Assessment - 07/25/18 1528    Subjective  Feeling better, not doing exercises as much as I was    Currently in Pain?  Yes    Pain Score  2     Pain Location  Leg    Pain Orientation  Left;Upper    Aggravating Factors   stairs                       [facility] Adult PT Treatment/Exercise - 07/25/18 0001      Ambulation/Gait   Gait Comments  resisted gait with SPC all directions, gait outside no cane up slope, down stairs step over step with hand rail and cane      Knee/Hip Exercises: Aerobic   Elliptical  R=5 I=8 x 3 minutes    Recumbent Bike  L3 x 5 min       Knee/Hip Exercises: Machines for Strengthening   Cybex Knee Extension  10# left only 3x10    Cybex Knee Flexion  left only 3x10 25#    Other Machine  5# hip abduction and extension 2x10      Knee/Hip Exercises: Standing   Step Down  3 sets;10 reps;Step Height: 4"               PT Short Term Goals - 06/21/18 1405      PT SHORT TERM GOAL #1   Title  Ind with initial HEP    Status  Achieved        PT Long Term Goals - 07/25/18 1741      PT  LONG TERM GOAL #1   Title  Pt able to ambulate community distances without AD.    Status  On-going      PT LONG TERM GOAL #3   Title  Patient able to perform ADLs with 2/10 pain or less in the left leg.    Status  Partially Met      PT LONG TERM GOAL #4   Title  Patient to demo 5/5 left hip and knee strength to normalize ADLS.    Status  On-going            Plan - 07/25/18 1721    Clinical Impression Statement  Patient overall doing well, he was very weak with the hip abduction on the left, he was able to do stairs with close SBA step over step, the gait outside wihtout device was good some cues for posture and then he was tired  and needed to rest.  C/O some cramping in the thigh after the exercises    PT Next Visit Plan  continue to push strength and function    Consulted and Agree with Plan of Care  Patient       Patient will benefit from skilled therapeutic intervention in order to improve the following deficits and impairments:  Abnormal gait, Pain, Decreased mobility, Decreased range of motion, Decreased strength  Visit Diagnosis: Stiffness of left hip, not elsewhere classified  Pain in left leg  Muscle weakness (generalized)  Difficulty in walking, not elsewhere classified     Problem List Patient Active Problem List   Diagnosis Date Noted  . Hyperlipemia   . Paresthesia     Sumner Boast., PT 07/25/2018, 5:42 PM  [place], [place], [address] Phone: [phone]   Fax:  [phone]  Name: Peter Stevens MRN: [id number] Date of Birth: Oct 30, 1960

## 2018-07-28 ENCOUNTER — Other Ambulatory Visit: Payer: Self-pay

## 2018-07-28 ENCOUNTER — Ambulatory Visit: Payer: BLUE CROSS/BLUE SHIELD | Admitting: Physical Therapy

## 2018-07-28 ENCOUNTER — Encounter: Payer: Self-pay | Admitting: Physical Therapy

## 2018-07-28 DIAGNOSIS — M6281 Muscle weakness (generalized): Secondary | ICD-10-CM | POA: Diagnosis not present

## 2018-07-28 DIAGNOSIS — R262 Difficulty in walking, not elsewhere classified: Secondary | ICD-10-CM | POA: Diagnosis not present

## 2018-07-28 DIAGNOSIS — M79605 Pain in left leg: Secondary | ICD-10-CM

## 2018-07-28 DIAGNOSIS — M25652 Stiffness of left hip, not elsewhere classified: Secondary | ICD-10-CM

## 2018-07-28 NOTE — Patient Instructions (Addendum)
Balance: Three-Way Leg squat with opposite toe tap    Stand on left foot, hands on hips. Reach other foot forward __5__ times, sideways __5 times, back __5__ times.  When you get stronger, increase to 10 reps.    Mini Squat: Single Leg    Stand on left foot, with right foot forward. Reach forward for balance and do a mini squat. Keep knees in line with second toe. Knees do not go past toes. Keep knees together. Repeat _5_ times. Repeat with other leg for set. Work up to [employer].  Marland Kitchen  Heel Raise: Unilateral (Standing)    Balance on left foot, then rise on ball of foot. Repeat _5-10___ times per set. Do __2__ sets per session. Do _1___ sessions per day. Anterior Step-Down    Stand with both feet on __6 inch step. Step down in with right foot, touching heel to the floor and return _backwards with Left foot, 5-10__ times. Hold on to rail!!   HIP: Hamstrings - Supine  Place strap around foot. Raise leg up, keeping knee straight.  Bend opposite knee to protect back if indicated. Hold 30 seconds. 3 reps per set, 2 sets per day  Piriformis Stretch   Lying on back, cross your ankle on your thigh. pull knee toward opposite shoulder. Hold 30 seconds. Repeat 3 times. Do 2 sessions per day.   Calf Stretch    Place hands on wall at shoulder height. Keeping back leg straight, bend front leg, feet pointing forward, heels flat on floor. Lean forward slightly until stretch is felt in calf of back leg. Hold stretch __30_ seconds, breathing slowly in and out. Repeat stretch with other leg back. Do _1-2__ sessions per day. Variation: Use chair or table for support.  Quads / HF, Prone KNEE: Quadriceps - Prone    Place strap around ankle. Bring ankle toward buttocks. Press hip into surface. Hold 30 seconds. Repeat 3 times per session. Do 2 sessions per day.

## 2018-07-28 NOTE — Therapy (Signed)
[place], [place], [address] Phone: [phone]   Fax:  [phone]  Physical Therapy Treatment  Patient Details  Name: Peter Stevens MRN: [id number] Date of Birth: 1961-03-12 Referring Provider (PT): Ainsley Spinner PA-C; Altamese [place] MD   Encounter Date: 07/28/2018  PT End of Session - 07/28/18 1626    Visit Number  12    Date for PT Re-Evaluation  08/09/18    PT Start Time  1619    PT Stop Time  1705    PT Time Calculation (min)  46 min    Activity Tolerance  Patient tolerated treatment well    Behavior During Therapy  [facility] for tasks assessed/performed       Past Medical History:  Diagnosis Date  . Herpes simplex   . Hyperlipemia   . Paresthesia     History reviewed. No pertinent surgical history.  There were no vitals filed for this visit.  Subjective Assessment - 07/28/18 1626    Subjective  Pt reports he is doing more now, but there is still pain in his Lt thigh.  He would like to be able to run/jump or hop prior to d/c from therapy.      Patient Stated Goals  get back to walking normally    Currently in Pain?  Yes    Pain Score  1     Pain Location  Leg    Pain Orientation  Left;Upper    Pain Descriptors / Indicators  Dull;Aching    Aggravating Factors   prolonged weight bearing    Pain Relieving Factors  rest, massage        [facility] Adult PT Treatment/Exercise - 07/28/18 0001      Ambulation/Gait   Gait Comments  20 ft trials x 5,  without AD and supervision, with cues for narrowing BOS; tactile cues to correct Lt upper body shift with Lt stance. Cues to even weight shift.        Knee/Hip Exercises: Stretches   Passive Hamstring Stretch  Left;2 reps;30 seconds    Quad Stretch  Left;3 reps;30 seconds   seated, standing, and prone   Hip Flexor Stretch  Left;2 reps;20 seconds   seated and standing   Piriformis Stretch  Left;Right;2 reps;30 seconds      Knee/Hip  Exercises: Aerobic   Elliptical  I=10, R=0 x 6 min       Knee/Hip Exercises: Standing   Heel Raises  Left;Right;1 set;10 reps    Step Down  Right;1 set;Hand Hold: 1;Step Height: 6"   8 reps, with retro step up. eccentric lowering   SLS  Lt mini squats with Rt toe taps forward, side, backward x 8 reps.  Lt SLS without UE support x 15 sec       Knee/Hip Exercises: Seated   Sit to Sand  5 reps;without UE support   Rt foot forward; eccentric lowering     Knee/Hip Exercises: Sidelying   Hip ABduction  Strengthening;Left;2 sets;10 reps             PT Education - 07/28/18 1720    Education Details  HEP - updated.     Person(s) Educated  Patient    Methods  Explanation;Handout;Verbal cues;Demonstration    Comprehension  Verbalized understanding;Returned demonstration       PT Short Term Goals - 06/21/18 1405      PT SHORT TERM GOAL #1   Title  Ind with initial  HEP    Status  Achieved        PT Long Term Goals - 07/28/18 1712      PT LONG TERM GOAL #1   Title  Pt able to ambulate community distances without AD.    Time  8    Period  Weeks    Status  On-going      PT LONG TERM GOAL #2   Title  Pt to demo left hip WFL to complete ADLS and work functions.    Time  8    Period  Weeks    Status  Partially Met      PT LONG TERM GOAL #3   Title  Patient able to perform ADLs with 2/10 pain or less in the left leg.    Time  8    Period  Weeks    Status  Partially Met      PT LONG TERM GOAL #4   Title  Patient to demo 5/5 left hip and knee strength to normalize ADLS.    Time  8    Period  Weeks    Status  On-going      PT LONG TERM GOAL #5   Title  Patient able to demonstrate good balance on LLE to prevent falls.    Time  8    Period  Weeks    Status  On-going            Plan - 07/28/18 1722    Clinical Impression Statement  Pt continues with weak Lt hip abductors, visible with gait deviations.  Pt able to tolerate more Lt single leg strengthening  exercises with min increase in pain.  HEP was updated to include more challenging exercises.  Pt has partially met his goals and will benefit from continued PT intervention to maximize functional mobility with less pain.     Rehab Potential  Excellent    PT Frequency  2x / week    PT Duration  8 weeks    PT Treatment/Interventions  ADLs/Self Care Home Management;Cryotherapy;Electrical Stimulation;Moist Heat;Therapeutic exercise;Balance training;Neuromuscular re-education;Stair training;Gait training;Patient/family education;Manual techniques;Vasopneumatic Device    PT Next Visit Plan  continue progressive LLE functional strengthening and gait.     Consulted and Agree with Plan of Care  Patient       Patient will benefit from skilled therapeutic intervention in order to improve the following deficits and impairments:  Abnormal gait, Pain, Decreased mobility, Decreased range of motion, Decreased strength  Visit Diagnosis: Stiffness of left hip, not elsewhere classified  Pain in left leg  Muscle weakness (generalized)     Problem List Patient Active Problem List   Diagnosis Date Noted  . Hyperlipemia   . Paresthesia    Kerin Perna, PTA 07/28/18 5:25 PM  [place] [address] [place], [place], [address] Phone: [phone]   Fax:  [phone]  Name: Peter Stevens MRN: [id number] Date of Birth: 12-Aug-1960

## 2018-08-01 ENCOUNTER — Encounter: Payer: BLUE CROSS/BLUE SHIELD | Admitting: Physical Therapy

## 2018-08-04 ENCOUNTER — Encounter: Payer: BLUE CROSS/BLUE SHIELD | Admitting: Physical Therapy

## 2018-08-08 ENCOUNTER — Encounter: Payer: BLUE CROSS/BLUE SHIELD | Admitting: Physical Therapy

## 2018-08-11 ENCOUNTER — Encounter: Payer: BLUE CROSS/BLUE SHIELD | Admitting: Physical Therapy

## 2018-08-15 ENCOUNTER — Encounter: Payer: BLUE CROSS/BLUE SHIELD | Admitting: Physical Therapy

## 2018-08-17 DIAGNOSIS — S72302D Unspecified fracture of shaft of left femur, subsequent encounter for closed fracture with routine healing: Secondary | ICD-10-CM | POA: Diagnosis not present

## 2018-08-17 DIAGNOSIS — S72102D Unspecified trochanteric fracture of left femur, subsequent encounter for closed fracture with routine healing: Secondary | ICD-10-CM | POA: Diagnosis not present

## 2018-08-18 ENCOUNTER — Other Ambulatory Visit: Payer: Self-pay

## 2018-08-18 ENCOUNTER — Encounter: Payer: Self-pay | Admitting: Physical Therapy

## 2018-08-18 ENCOUNTER — Ambulatory Visit: Payer: BLUE CROSS/BLUE SHIELD | Attending: Orthopedic Surgery | Admitting: Physical Therapy

## 2018-08-18 ENCOUNTER — Encounter: Payer: BLUE CROSS/BLUE SHIELD | Admitting: Physical Therapy

## 2018-08-18 DIAGNOSIS — R2689 Other abnormalities of gait and mobility: Secondary | ICD-10-CM | POA: Insufficient documentation

## 2018-08-18 DIAGNOSIS — R262 Difficulty in walking, not elsewhere classified: Secondary | ICD-10-CM | POA: Diagnosis not present

## 2018-08-18 DIAGNOSIS — M79605 Pain in left leg: Secondary | ICD-10-CM | POA: Diagnosis not present

## 2018-08-18 DIAGNOSIS — M6281 Muscle weakness (generalized): Secondary | ICD-10-CM | POA: Diagnosis not present

## 2018-08-18 DIAGNOSIS — M25652 Stiffness of left hip, not elsewhere classified: Secondary | ICD-10-CM | POA: Diagnosis not present

## 2018-08-18 NOTE — Therapy (Signed)
[place], [place], [address] Phone: [phone]   Fax:  [phone]  Physical Therapy Treatment  Patient Details  Name: Peter Stevens MRN: [id number] Date of Birth: [birth year] Referring Provider (PT): Ainsley Spinner PA-C; Altamese Avoca MD   Encounter Date: 08/18/2018  PT End of Session - 08/18/18 0932    Visit Number  13    Date for PT Re-Evaluation  08/09/18    PT Start Time  0846    PT Stop Time  0931    PT Time Calculation (min)  45 min    Activity Tolerance  Patient tolerated treatment well    Behavior During Therapy  [facility] for tasks assessed/performed       Past Medical History:  Diagnosis Date  . Herpes simplex   . Hyperlipemia   . Paresthesia     History reviewed. No pertinent surgical history.  There were no vitals filed for this visit.  Subjective Assessment - 08/18/18 0849    Subjective  "I still have a little limp and pain in this muscle"    Currently in Pain?  Yes    Pain Score  1     Pain Location  Leg    Pain Orientation  Left;Upper                       [facility] Adult PT Treatment/Exercise - 08/18/18 0001      Knee/Hip Exercises: Aerobic   Elliptical  I=10, R=5 x 4 min     Recumbent Bike  L2 x 5 min       Knee/Hip Exercises: Machines for Strengthening   Cybex Knee Extension  10# left only 2x10    Cybex Knee Flexion  left only 3x10 20#      Knee/Hip Exercises: Standing   Heel Raises  10 reps;15 reps;2 seconds;Both;2 sets    Lateral Step Up  Left;1 set;10 reps;Hand Hold: 0;Step Height: 6"    Step Down  Right;1 set;Step Height: 6";Hand Hold: 0;10 reps    Walking with Sports Cord  40lb 4 way x3 each    Other Standing Knee Exercises  Split squats with sink support 2x5 each    Other Standing Knee Exercises  S2S LLE back 2x10       Knee/Hip Exercises: Sidelying   Hip ABduction  Strengthening;Left;2 sets;10 reps   10lb               PT Short  Term Goals - 06/21/18 1405      PT SHORT TERM GOAL #1   Title  Ind with initial HEP    Status  Achieved        PT Long Term Goals - 07/28/18 1712      PT LONG TERM GOAL #1   Title  Pt able to ambulate community distances without AD.    Time  8    Period  Weeks    Status  On-going      PT LONG TERM GOAL #2   Title  Pt to demo left hip WFL to complete ADLS and work functions.    Time  8    Period  Weeks    Status  Partially Met      PT LONG TERM GOAL #3   Title  Patient able to perform ADLs with 2/10 pain or less in the left leg.    Time  8    Period  Weeks    Status  Partially Met      PT LONG TERM GOAL #4   Title  Patient to demo 5/5 left hip and knee strength to normalize ADLS.    Time  8    Period  Weeks    Status  On-going      PT LONG TERM GOAL #5   Title  Patient able to demonstrate good balance on LLE to prevent falls.    Time  8    Period  Weeks    Status  On-going            Plan - 08/18/18 0935    Clinical Impression Statement  Pt returns to therapy after 3 weeks due to covid limitations. He ambulates with a limp, later shifting to L when wt bearing. He did well isolating LLE during the interventions when it was required. Cues to distribute pressure evenly with split squats at sink. Visible L quad shaking noted with resisted gait and step ups. He reports hip weakness with resisted side step.    Rehab Potential  Excellent    PT Frequency  2x / week    PT Duration  8 weeks    PT Treatment/Interventions  ADLs/Self Care Home Management;Cryotherapy;Electrical Stimulation;Moist Heat;Therapeutic exercise;Balance training;Neuromuscular re-education;Stair training;Gait training;Patient/family education;Manual techniques;Vasopneumatic Device    PT Next Visit Plan  continue progressive LLE functional strengthening and gait.        Patient will benefit from skilled therapeutic intervention in order to improve the following deficits and impairments:  Abnormal  gait, Pain, Decreased mobility, Decreased range of motion, Decreased strength  Visit Diagnosis: Stiffness of left hip, not elsewhere classified  Pain in left leg  Muscle weakness (generalized)  Difficulty in walking, not elsewhere classified     Problem List Patient Active Problem List   Diagnosis Date Noted  . Hyperlipemia   . Paresthesia     Scot Jun, PTA 08/18/2018, 9:44 AM  [place], [place], [address] Phone: [phone]   Fax:  [phone]  Name: KARTHIKEYA FUNKE MRN: [id number] Date of Birth: [birth year]

## 2018-08-22 ENCOUNTER — Encounter: Payer: BLUE CROSS/BLUE SHIELD | Admitting: Physical Therapy

## 2018-08-25 ENCOUNTER — Encounter: Payer: Self-pay | Admitting: Physical Therapy

## 2018-08-25 ENCOUNTER — Encounter: Payer: BLUE CROSS/BLUE SHIELD | Admitting: Physical Therapy

## 2018-08-25 ENCOUNTER — Other Ambulatory Visit: Payer: Self-pay

## 2018-08-25 ENCOUNTER — Ambulatory Visit: Payer: BLUE CROSS/BLUE SHIELD | Admitting: Physical Therapy

## 2018-08-25 DIAGNOSIS — M6281 Muscle weakness (generalized): Secondary | ICD-10-CM

## 2018-08-25 DIAGNOSIS — M79605 Pain in left leg: Secondary | ICD-10-CM

## 2018-08-25 DIAGNOSIS — R2689 Other abnormalities of gait and mobility: Secondary | ICD-10-CM | POA: Diagnosis not present

## 2018-08-25 DIAGNOSIS — R262 Difficulty in walking, not elsewhere classified: Secondary | ICD-10-CM

## 2018-08-25 DIAGNOSIS — M25652 Stiffness of left hip, not elsewhere classified: Secondary | ICD-10-CM | POA: Diagnosis not present

## 2018-08-25 NOTE — Therapy (Signed)
[facility] [place], [place], [address] Phone: [phone]   Fax:  [phone]  Physical Therapy Treatment  Patient Details  Name: Peter Stevens MRN: [id number] Date of Birth: 1960/06/16 Referring Provider (PT): Ainsley Spinner PA-C; Altamese Makoti MD   Encounter Date: 08/25/2018  PT End of Session - 08/25/18 0927    Visit Number  14    Date for PT Re-Evaluation  09/17/18    PT Start Time  0842    PT Stop Time  0927    PT Time Calculation (min)  45 min    Activity Tolerance  Patient tolerated treatment well    Behavior During Therapy  [facility] for tasks assessed/performed       Past Medical History:  Diagnosis Date  . Herpes simplex   . Hyperlipemia   . Paresthesia     History reviewed. No pertinent surgical history.  There were no vitals filed for this visit.  Subjective Assessment - 08/25/18 0842    Subjective  Haven't used SPC in a week. "I feel good, the muscle still hurts on the top"    Currently in Pain?  Yes    Pain Score  1     Pain Location  Leg    Pain Orientation  Left;Anterior;Proximal                       [facility] Adult PT Treatment/Exercise - 08/25/18 0001      Knee/Hip Exercises: Aerobic   Elliptical  I=10, R=5 x 4 min     Recumbent Bike  L2 x 4 min       Knee/Hip Exercises: Machines for Strengthening   Cybex Knee Extension  10# left only 2x10    Cybex Knee Flexion  left only 25lb  2x10     Cybex Leg Press  40lb 2x10, LLE 20lb 2x10       Knee/Hip Exercises: Standing   Hip Abduction  Both;2 sets;10 reps;Knee straight    Abduction Limitations  10    Hip Extension  Stengthening;Both;2 sets;10 reps;Knee straight    Extension Limitations  10    Lateral Step Up  Left;10 reps;Hand Hold: 0;Step Height: 8";1 set    Forward Step Up  Left;2 sets;10 reps;Hand Hold: 0;Step Height: 8"    Walking with Sports Cord  50lb 4 way x3 each    Other Standing Knee Exercises   controlled descents 4in x10                PT Short Term Goals - 06/21/18 1405      PT SHORT TERM GOAL #1   Title  Ind with initial HEP    Status  Achieved        PT Long Term Goals - 08/25/18 0859      PT LONG TERM GOAL #1   Title  Pt able to ambulate community distances without AD.    Status  Partially Met      PT LONG TERM GOAL #2   Title  Pt to demo left hip WFL to complete ADLS and work functions.    Status  Partially Met      PT LONG TERM GOAL #3   Title  Patient able to perform ADLs with 2/10 pain or less in the left leg.    Status  Partially Met      PT LONG TERM GOAL #4   Title  Patient to demo  5/5 left hip and knee strength to normalize ADLS.    Status  Partially Met      PT LONG TERM GOAL #5   Title  Patient able to demonstrate good balance on LLE to prevent falls.    Status  Achieved   10 sec LLE x3 independent            Plan - 08/25/18 0928    Clinical Impression Statement  Pt is progressing towards all goals. Pt has been ambulating for a week with SPC. He was able to complete all of the exercises, visible quad weakness noted with SLS, controlled descents, and seated extensions. Some weakness and instability with resisted gait when the resistance in his R hip. Progressed to 8 inch step ups, cues to slow down and control movement.     Rehab Potential  Excellent    PT Frequency  2x / week    PT Duration  8 weeks    PT Treatment/Interventions  ADLs/Self Care Home Management;Cryotherapy;Electrical Stimulation;Moist Heat;Therapeutic exercise;Balance training;Neuromuscular re-education;Stair training;Gait training;Patient/family education;Manual techniques;Vasopneumatic Device    PT Next Visit Plan  continue progressive LLE functional strengthening and gait.        Patient will benefit from skilled therapeutic intervention in order to improve the following deficits and impairments:  Abnormal gait, Pain, Decreased mobility, Decreased range of  motion, Decreased strength  Visit Diagnosis: Stiffness of left hip, not elsewhere classified  Pain in left leg  Difficulty in walking, not elsewhere classified  Other abnormalities of gait and mobility  Muscle weakness (generalized)     Problem List Patient Active Problem List   Diagnosis Date Noted  . Hyperlipemia   . Paresthesia     Ronald G Pemberton, PTA 08/25/2018, 9:38 AM  [facility] [address] [place], [place], [address] Phone: [phone]   Fax:  [phone]  Name: Peter Stevens MRN: [id number] Date of Birth: 03/18/1961   

## 2018-08-29 ENCOUNTER — Encounter: Payer: BLUE CROSS/BLUE SHIELD | Admitting: Physical Therapy

## 2018-09-01 ENCOUNTER — Encounter: Payer: BLUE CROSS/BLUE SHIELD | Admitting: Physical Therapy

## 2018-09-01 ENCOUNTER — Ambulatory Visit: Payer: BLUE CROSS/BLUE SHIELD | Admitting: Physical Therapy

## 2018-09-01 ENCOUNTER — Other Ambulatory Visit: Payer: Self-pay

## 2018-09-01 ENCOUNTER — Encounter: Payer: Self-pay | Admitting: Physical Therapy

## 2018-09-01 DIAGNOSIS — R2689 Other abnormalities of gait and mobility: Secondary | ICD-10-CM | POA: Diagnosis not present

## 2018-09-01 DIAGNOSIS — M25652 Stiffness of left hip, not elsewhere classified: Secondary | ICD-10-CM

## 2018-09-01 DIAGNOSIS — M79605 Pain in left leg: Secondary | ICD-10-CM | POA: Diagnosis not present

## 2018-09-01 DIAGNOSIS — R262 Difficulty in walking, not elsewhere classified: Secondary | ICD-10-CM

## 2018-09-01 DIAGNOSIS — M6281 Muscle weakness (generalized): Secondary | ICD-10-CM | POA: Diagnosis not present

## 2018-09-01 NOTE — Therapy (Signed)
[facility] [address] [place], [place], [address] Phone: [phone]   Fax:  [phone]  Physical Therapy Treatment  Patient Details  Name: Peter Stevens MRN: [id number] Date of Birth: 01/28/1961 Referring Provider (PT): Keith Paul PA-C; Winter Handy MD   Encounter Date: 09/01/2018  PT End of Session - 09/01/18 0928    Visit Number  15    PT Start Time  0845    PT Stop Time  0928    PT Time Calculation (min)  43 min    Activity Tolerance  Patient tolerated treatment well    Behavior During Therapy  [facility] for tasks assessed/performed       Past Medical History:  Diagnosis Date  . Herpes simplex   . Hyperlipemia   . Paresthesia     History reviewed. No pertinent surgical history.  There were no vitals filed for this visit.  Subjective Assessment - 09/01/18 0846    Subjective  Pt reports walking his dogs for 1.2 miles Saturday and Sunday. He stated he was really sore after the second walk.     Currently in Pain?  Yes    Pain Score  1     Pain Location  Leg    Pain Orientation  Right;Anterior;Proximal         [facility] PT Assessment - 09/01/18 0001      ROM / Strength   AROM / PROM / Strength  Strength      AROM   Overall AROM   Within functional limits for tasks performed      Strength   Overall Strength Comments  L hip grossly 4+/5, L quad 4/5, L HS 3+/5                   [facility] Adult PT Treatment/Exercise - 09/01/18 0001      Ambulation/Gait   Stairs  Yes    Stairs Assistance  7: Independent;6: Modified independent (Device/Increase time)    Stair Management Technique  No rails;Alternating pattern    Number of Stairs  36    Height of Stairs  6    Gait Comments  LLE weakness noted going up and down.      Knee/Hip Exercises: Aerobic   Recumbent Bike  L2 x 5 min       Knee/Hip Exercises: Machines for Strengthening   Cybex Knee Extension  10# left only 2x15    Cybex Knee  Flexion  left only 25lb  2x15    Cybex Leg Press  50lb 2x15, LLE 30lb 2x10       Knee/Hip Exercises: Standing   Hip Abduction  Both;2 sets;10 reps;Knee straight    Abduction Limitations  10    Hip Extension  Stengthening;Both;2 sets;10 reps;Knee straight    Extension Limitations  10    Lateral Step Up  Left;10 reps;Hand Hold: 0;Step Height: 8";1 set    Other Standing Knee Exercises  controlled descents 4in 2x15 LLE                PT Short Term Goals - 06/21/18 1405      PT SHORT TERM GOAL #1   Title  Ind with initial HEP    Status  Achieved        PT Long Term Goals - 09/01/18 0852      PT LONG TERM GOAL #1   Title  Pt able to ambulate community distances without AD.    Status    Achieved            Plan - 09/01/18 0929    Clinical Impression Statement  Pt has progressed increasing his L hip strength. Has also met ambulation goal. He still has a mild limp with walking. Weakness noted with stair ambulation. Visible quad shaking with controlled descents. Hips fatigues quick with standing hip exercises.     PT Treatment/Interventions  ADLs/Self Care Home Management;Cryotherapy;Electrical Stimulation;Moist Heat;Therapeutic exercise;Balance training;Neuromuscular re-education;Stair training;Gait training;Patient/family education;Manual techniques;Vasopneumatic Device    PT Next Visit Plan  continue progressive LLE functional strengthening and gait.        Patient will benefit from skilled therapeutic intervention in order to improve the following deficits and impairments:     Visit Diagnosis: Pain in left leg  Stiffness of left hip, not elsewhere classified  Difficulty in walking, not elsewhere classified  Other abnormalities of gait and mobility  Muscle weakness (generalized)     Problem List Patient Active Problem List   Diagnosis Date Noted  . Hyperlipemia   . Paresthesia     Scot Jun, PTA 09/01/2018, 9:31 AM  [place], [place], [address] Phone: [phone]   Fax:  [phone]  Name: Peter Stevens MRN: [id number] Date of Birth: January 17, 1961

## 2018-09-05 ENCOUNTER — Encounter: Payer: BLUE CROSS/BLUE SHIELD | Admitting: Physical Therapy

## 2018-09-08 ENCOUNTER — Encounter: Payer: BLUE CROSS/BLUE SHIELD | Admitting: Physical Therapy

## 2018-09-08 ENCOUNTER — Ambulatory Visit: Payer: BLUE CROSS/BLUE SHIELD | Admitting: Physical Therapy

## 2018-09-08 ENCOUNTER — Encounter: Payer: Self-pay | Admitting: Physical Therapy

## 2018-09-08 ENCOUNTER — Other Ambulatory Visit: Payer: Self-pay

## 2018-09-08 DIAGNOSIS — R262 Difficulty in walking, not elsewhere classified: Secondary | ICD-10-CM | POA: Diagnosis not present

## 2018-09-08 DIAGNOSIS — M25652 Stiffness of left hip, not elsewhere classified: Secondary | ICD-10-CM

## 2018-09-08 DIAGNOSIS — M6281 Muscle weakness (generalized): Secondary | ICD-10-CM | POA: Diagnosis not present

## 2018-09-08 DIAGNOSIS — R2689 Other abnormalities of gait and mobility: Secondary | ICD-10-CM

## 2018-09-08 DIAGNOSIS — M79605 Pain in left leg: Secondary | ICD-10-CM | POA: Diagnosis not present

## 2018-09-08 NOTE — Therapy (Signed)
[place], [place], [address] Phone: [phone]   Fax:  [phone]  Physical Therapy Treatment  Patient Details  Name: CAVON NICOLLS MRN: [id number] Date of Birth: 06/01/1960 Referring Provider (PT): Ainsley Spinner PA-C; Altamese [place] MD   Encounter Date: 09/08/2018  PT End of Session - 09/08/18 0929    Visit Number  16    Date for PT Re-Evaluation  09/17/18    PT Start Time  0847    PT Stop Time  0930    PT Time Calculation (min)  43 min    Activity Tolerance  Patient tolerated treatment well    Behavior During Therapy  [facility] for tasks assessed/performed       Past Medical History:  Diagnosis Date  . Herpes simplex   . Hyperlipemia   . Paresthesia     History reviewed. No pertinent surgical history.  There were no vitals filed for this visit.  Subjective Assessment - 09/08/18 0848    Subjective  "Doing pretty good"    Currently in Pain?  Yes    Pain Score  1     Pain Location  Leg    Pain Orientation  Right                       [facility] Adult PT Treatment/Exercise - 09/08/18 0001      High Level Balance   High Level Balance Comments  SLS rebound nall toss x15, on round airex x 15       Knee/Hip Exercises: Aerobic   Elliptical  I=12, R=6 x 4 min     Recumbent Bike  L2 x 5 min       Knee/Hip Exercises: Machines for Strengthening   Cybex Knee Extension  15# left only 2x10    Cybex Knee Flexion  left only 25lb  2x15    Cybex Leg Press  60lb 2x15, LLE 40lb 2x10       Knee/Hip Exercises: Standing   Heel Raises  15 reps;2 seconds;Both;2 sets    Forward Step Up  Left;1 set;10 reps;Hand Hold: 0;Step Height: 8"   8lb Dumbbells in hand    Other Standing Knee Exercises  LLE SL DL 4lb 3x5       Knee/Hip Exercises: Seated   Sit to Sand  2 sets;10 reps;without UE support   From mat table, Airex under LE, holding 9lb               PT Short Term Goals -  06/21/18 1405      PT SHORT TERM GOAL #1   Title  Ind with initial HEP    Status  Achieved        PT Long Term Goals - 09/08/18 0933      PT LONG TERM GOAL #1   Title  Pt able to ambulate community distances without AD.    Status  Achieved      PT LONG TERM GOAL #2   Status  Partially Met      PT LONG TERM GOAL #3   Title  Patient able to perform ADLs with 2/10 pain or less in the left leg.    Status  Partially Met      PT LONG TERM GOAL #4   Title  Patient to demo 5/5 left hip and knee strength to normalize ADLS.    Status  Partially Met  PT LONG TERM GOAL #5   Title  Patient able to demonstrate good balance on LLE to prevent falls.    Status  Achieved            Plan - 09/08/18 0933    Clinical Impression Statement  Pt showed good strength overall with today's activities. He was able to progress with resistance on leg press. He had some difficulty with SL DE due to weakness, flexibility, and instability. Pt also reports some pain with the DL. Cues needed with step ups not to compensate but again good strength overall. Some instability noted with Rebound ball toss. He continues to favor his R side with sit to stands despite cues.     Rehab Potential  Excellent    PT Frequency  2x / week    PT Duration  8 weeks    PT Treatment/Interventions  ADLs/Self Care Home Management;Cryotherapy;Electrical Stimulation;Moist Heat;Therapeutic exercise;Balance training;Neuromuscular re-education;Stair training;Gait training;Patient/family education;Manual techniques;Vasopneumatic Device    PT Next Visit Plan  continue progressive LLE functional strengthening and gait.        Patient will benefit from skilled therapeutic intervention in order to improve the following deficits and impairments:  Abnormal gait, Pain, Decreased mobility, Decreased range of motion, Decreased strength  Visit Diagnosis: Pain in left leg  Stiffness of left hip, not elsewhere classified  Difficulty  in walking, not elsewhere classified  Other abnormalities of gait and mobility     Problem List Patient Active Problem List   Diagnosis Date Noted  . Hyperlipemia   . Paresthesia     Scot Jun, PTA 09/08/2018, 9:36 AM  [place], [place], [address] Phone: [phone]   Fax:  [phone]  Name: IRVING BLOOR MRN: [id number] Date of Birth: September 01, 1960

## 2018-09-15 ENCOUNTER — Ambulatory Visit: Payer: BC Managed Care – PPO | Attending: Orthopedic Surgery | Admitting: Physical Therapy

## 2018-09-15 ENCOUNTER — Other Ambulatory Visit: Payer: Self-pay

## 2018-09-15 ENCOUNTER — Encounter: Payer: Self-pay | Admitting: Physical Therapy

## 2018-09-15 DIAGNOSIS — R262 Difficulty in walking, not elsewhere classified: Secondary | ICD-10-CM

## 2018-09-15 DIAGNOSIS — M25652 Stiffness of left hip, not elsewhere classified: Secondary | ICD-10-CM | POA: Diagnosis not present

## 2018-09-15 DIAGNOSIS — R2689 Other abnormalities of gait and mobility: Secondary | ICD-10-CM | POA: Diagnosis not present

## 2018-09-15 DIAGNOSIS — M6281 Muscle weakness (generalized): Secondary | ICD-10-CM | POA: Diagnosis not present

## 2018-09-15 DIAGNOSIS — M79605 Pain in left leg: Secondary | ICD-10-CM | POA: Diagnosis not present

## 2018-09-15 NOTE — Therapy (Signed)
[place], [place], [address] Phone: [phone]   Fax:  [phone]  Physical Therapy Treatment  Patient Details  Name: Peter Stevens MRN: [id number] Date of Birth: 1960-06-21 Referring Provider (PT): Ainsley Spinner PA-C; Altamese [place] MD   Encounter Date: 09/15/2018  PT End of Session - 09/15/18 0933    Visit Number  17    Date for PT Re-Evaluation  09/17/18    PT Start Time  0847    PT Stop Time  0930    PT Time Calculation (min)  43 min    Activity Tolerance  Patient tolerated treatment well    Behavior During Therapy  [facility] for tasks assessed/performed       Past Medical History:  Diagnosis Date  . Herpes simplex   . Hyperlipemia   . Paresthesia     History reviewed. No pertinent surgical history.  There were no vitals filed for this visit.  Subjective Assessment - 09/15/18 0849    Subjective  "Doing pretty good, did some lunges" Pt reports the normal L quad soreness    Currently in Pain?  Yes    Pain Score  1     Pain Location  Leg    Pain Orientation  Left                       [facility] Adult PT Treatment/Exercise - 09/15/18 0001      Ambulation/Gait   Stairs  Yes    Gait Comments  2 flights skip a step on the way up. very taxing on pt       Knee/Hip Exercises: Stretches   Other Knee/Hip Stretches  Thaomas stretch 5x10''      Knee/Hip Exercises: Aerobic   Elliptical  I=12, R=6 x 6 min       Knee/Hip Exercises: Machines for Strengthening   Cybex Leg Press  70lb 2x15, LLE 40lb 2x15       Knee/Hip Exercises: Standing   Heel Raises  15 reps;2 seconds;Both;2 sets    Lateral Step Up  Left;2 sets;10 reps;Hand Hold: 0;Step Height: 8"   with RLE hip flex   Walking with Sports Cord  40lb front and each side x5 with 6 inch step up    Other Standing Knee Exercises  LLE SL DL 3lb 2x5       Knee/Hip Exercises: Seated   Sit to Sand  3 sets;5 reps;without UE support    LLE only from elevated UBE              PT Short Term Goals - 06/21/18 1405      PT SHORT TERM GOAL #1   Title  Ind with initial HEP    Status  Achieved        PT Long Term Goals - 09/15/18 0933      PT LONG TERM GOAL #1   Title  Pt able to ambulate community distances without AD.    Status  Achieved      PT LONG TERM GOAL #2   Title  Pt to demo left hip WFL to complete ADLS and work functions.    Status  Partially Met      PT LONG TERM GOAL #4   Title  Patient to demo 5/5 left hip and knee strength to normalize ADLS.    Status  Partially Met      PT LONG TERM GOAL #5  Title  Patient able to demonstrate good balance on LLE to prevent falls.    Status  Achieved            Plan - 09/15/18 0934    Clinical Impression Statement  Pt did good overall with good effort. He did have some difficulty with SL dead lift, resisted step ups, and SL sit to stand due to weakness. Bilat hip weakness and instability noted more with resisted step ups. L quad tightness noted with thomas stretch. Pt could benefit from continued skilled PT services to maximize functional level.     Rehab Potential  Excellent    PT Frequency  2x / week    PT Duration  8 weeks    PT Treatment/Interventions  ADLs/Self Care Home Management;Cryotherapy;Electrical Stimulation;Moist Heat;Therapeutic exercise;Balance training;Neuromuscular re-education;Stair training;Gait training;Patient/family education;Manual techniques;Vasopneumatic Device    PT Next Visit Plan  continue progressive LLE functional strengthening and gait. Unable to progress to I gym HEP due to covid       Patient will benefit from skilled therapeutic intervention in order to improve the following deficits and impairments:  Abnormal gait, Pain, Decreased mobility, Decreased range of motion, Decreased strength  Visit Diagnosis: Stiffness of left hip, not elsewhere classified  Pain in left leg  Difficulty in walking, not elsewhere  classified     Problem List Patient Active Problem List   Diagnosis Date Noted  . Hyperlipemia   . Paresthesia     Scot Jun, PTA 09/15/2018, 9:44 AM  [place], [place], [address] Phone: [phone]   Fax:  [phone]  Name: Peter Stevens MRN: [id number] Date of Birth: 10-29-1960

## 2018-09-16 DIAGNOSIS — Z23 Encounter for immunization: Secondary | ICD-10-CM | POA: Diagnosis not present

## 2018-09-22 ENCOUNTER — Other Ambulatory Visit: Payer: Self-pay

## 2018-09-22 ENCOUNTER — Encounter: Payer: Self-pay | Admitting: Physical Therapy

## 2018-09-22 ENCOUNTER — Ambulatory Visit: Payer: BC Managed Care – PPO | Admitting: Physical Therapy

## 2018-09-22 DIAGNOSIS — M6281 Muscle weakness (generalized): Secondary | ICD-10-CM

## 2018-09-22 DIAGNOSIS — M79605 Pain in left leg: Secondary | ICD-10-CM

## 2018-09-22 DIAGNOSIS — R2689 Other abnormalities of gait and mobility: Secondary | ICD-10-CM | POA: Diagnosis not present

## 2018-09-22 DIAGNOSIS — R262 Difficulty in walking, not elsewhere classified: Secondary | ICD-10-CM | POA: Diagnosis not present

## 2018-09-22 DIAGNOSIS — M25652 Stiffness of left hip, not elsewhere classified: Secondary | ICD-10-CM

## 2018-09-22 NOTE — Therapy (Signed)
[place], [place], [address] Phone: [phone]   Fax:  [phone]  Physical Therapy Treatment  Patient Details  Name: Peter Stevens MRN: [id number] Date of Birth: Mar 14, 1961 Referring Provider (PT): Ainsley Spinner PA-C; Altamese Bulloch MD   Encounter Date: 09/22/2018  PT End of Session - 09/22/18 0932    Visit Number  18    PT Start Time  0845    PT Stop Time  0930    PT Time Calculation (min)  45 min       Past Medical History:  Diagnosis Date  . Herpes simplex   . Hyperlipemia   . Paresthesia     History reviewed. No pertinent surgical history.  There were no vitals filed for this visit.  Subjective Assessment - 09/22/18 0850    Subjective  "Overall I am doing good" PT reports that's he can tell a difference in his VMO muscles not being as define af the non surgical leg.    Currently in Pain?  Yes    Pain Score  1     Pain Location  Hip    Pain Orientation  Left;Lateral                       [facility] Adult PT Treatment/Exercise - 09/22/18 0001      Ambulation/Gait   Stairs  Yes    Gait Comments  3 flights skip a step on the way up. very taxing on pt       Knee/Hip Exercises: Aerobic   Elliptical  I=13, R=7 x 5 min     Recumbent Bike  L3 x 5 min       Knee/Hip Exercises: Machines for Strengthening   Cybex Knee Extension  20lb 2x10, LLE 12lb 2x10    Cybex Knee Flexion  45lb 2x15, RLE 25lb 2x10     Cybex Leg Press  70lb 2x15, LLE 40lb 2x15       Knee/Hip Exercises: Standing   Heel Raises  15 reps;2 seconds;Both;2 sets    Lateral Step Up  Left;2 sets;10 reps;Hand Hold: 0;Step Height: 8"    Walking with Sports Cord  40lb front and each side x5 with 6 inch step up    Other Standing Knee Exercises  controlled descents 6in 2x15 LLE       Knee/Hip Exercises: Seated   Sit to Sand  2 sets;10 reps;without UE support   With ball squeeze holding 10lb               PT Short Term Goals - 06/21/18 1405      PT SHORT TERM GOAL #1   Title  Ind with initial HEP    Status  Achieved        PT Long Term Goals - 09/22/18 0933      PT LONG TERM GOAL #1   Status  Achieved      PT LONG TERM GOAL #2   Title  Pt to demo left hip WFL to complete ADLS and work functions.    Status  Partially Met      PT LONG TERM GOAL #3   Title  Patient able to perform ADLs with 2/10 pain or less in the left leg.    Status  Partially Met      PT LONG TERM GOAL #4   Title  Patient to demo 5/5 left hip and knee strength to normalize  ADLS.    Status  Partially Met      PT LONG TERM GOAL #5   Title  Patient able to demonstrate good balance on LLE to prevent falls.    Status  Achieved            Plan - 09/22/18 0934    Clinical Impression Statement  Despite feeling better overall he sill has some LLE weakness compared to the right noted with functional interventions. Pt also continues to report L hip and quad pain throughout the day. He still has some hip weakness ans instability with resisted side step. LLE did fatigue quick with isolated interventions.     Rehab Potential  Excellent    PT Frequency  2x / week    PT Duration  8 weeks    PT Treatment/Interventions  ADLs/Self Care Home Management;Cryotherapy;Electrical Stimulation;Moist Heat;Therapeutic exercise;Balance training;Neuromuscular re-education;Stair training;Gait training;Patient/family education;Manual techniques;Vasopneumatic Device    PT Next Visit Plan  continue progressive LLE functional strengthening and gait. Unable to progress to I gym HEP due to covid       Patient will benefit from skilled therapeutic intervention in order to improve the following deficits and impairments:  Abnormal gait, Pain, Decreased mobility, Decreased range of motion, Decreased strength  Visit Diagnosis: Stiffness of left hip, not elsewhere classified  Pain in left leg  Difficulty in walking,  not elsewhere classified  Other abnormalities of gait and mobility  Muscle weakness (generalized)     Problem List Patient Active Problem List   Diagnosis Date Noted  . Hyperlipemia   . Paresthesia     Scot Jun, PTA 09/22/2018, 9:39 AM  [place], [place], [address] Phone: [phone]   Fax:  [phone]  Name: EMMAUEL HALLUMS MRN: [id number] Date of Birth: 1960/10/30

## 2018-09-29 ENCOUNTER — Ambulatory Visit: Payer: BC Managed Care – PPO | Admitting: Physical Therapy

## 2018-09-29 ENCOUNTER — Other Ambulatory Visit: Payer: Self-pay

## 2018-09-29 ENCOUNTER — Encounter: Payer: Self-pay | Admitting: Physical Therapy

## 2018-09-29 DIAGNOSIS — M79605 Pain in left leg: Secondary | ICD-10-CM

## 2018-09-29 DIAGNOSIS — R2689 Other abnormalities of gait and mobility: Secondary | ICD-10-CM

## 2018-09-29 DIAGNOSIS — M25652 Stiffness of left hip, not elsewhere classified: Secondary | ICD-10-CM

## 2018-09-29 DIAGNOSIS — M6281 Muscle weakness (generalized): Secondary | ICD-10-CM

## 2018-09-29 DIAGNOSIS — R262 Difficulty in walking, not elsewhere classified: Secondary | ICD-10-CM | POA: Diagnosis not present

## 2018-09-29 NOTE — Therapy (Signed)
[place], [place], [address] Phone: [phone]   Fax:  [phone]  Physical Therapy Treatment  Patient Details  Name: Peter Stevens MRN: [id number] Date of Birth: 08-19-1960 Referring Provider (PT): Ainsley Spinner PA-C; Altamese [place] MD   Encounter Date: 09/29/2018  PT End of Session - 09/29/18 0934    Visit Number  19    Date for PT Re-Evaluation  10/23/18    PT Start Time  0845    PT Stop Time  0931    PT Time Calculation (min)  46 min    Activity Tolerance  Patient tolerated treatment well    Behavior During Therapy  [facility] for tasks assessed/performed       Past Medical History:  Diagnosis Date  . Herpes simplex   . Hyperlipemia   . Paresthesia     History reviewed. No pertinent surgical history.  There were no vitals filed for this visit.  Subjective Assessment - 09/29/18 0847    Subjective  "Doing fine"    Currently in Pain?  Yes    Pain Score  1     Pain Location  Leg    Pain Orientation  Left;Lateral;Proximal                       [facility] Adult PT Treatment/Exercise - 09/29/18 0001      Ambulation/Gait   Stairs  Yes    Gait Comments  3 flights skip a step on the way up      Knee/Hip Exercises: Aerobic   Elliptical  I=15, R=9  x2 min each    Recumbent Bike  L3 x 5 min       Knee/Hip Exercises: Machines for Strengthening   Cybex Knee Extension  20lb 2x15    Cybex Knee Flexion  55lb 2x10    Cybex Leg Press  70lb 2x15, LLE 50lb 2x10      Knee/Hip Exercises: Standing   Hip Abduction  Both;2 sets;10 reps;Knee straight    Abduction Limitations  10    Hip Extension  Stengthening;Both;2 sets;10 reps;Knee straight    Extension Limitations  10    Forward Step Up  1 set;Both;10 reps;Hand Hold: 0;Step Height: 8"   holding 7ln dumbbells    Functional Squat  2 sets;10 reps;3 seconds    Other Standing Knee Exercises  controlled descents 6in 2x15 LLE     Other  Standing Knee Exercises  LLE SL DL 3lb x10                PT Short Term Goals - 06/21/18 1405      PT SHORT TERM GOAL #1   Title  Ind with initial HEP    Status  Achieved        PT Long Term Goals - 09/29/18 0934      PT LONG TERM GOAL #1   Title  Pt able to ambulate community distances without AD.    Status  Achieved      PT LONG TERM GOAL #2   Title  Pt to demo left hip WFL to complete ADLS and work functions.    Status  Partially Met      PT LONG TERM GOAL #3   Title  Patient able to perform ADLs with 2/10 pain or less in the left leg.    Status  Partially Met      PT LONG TERM GOAL #4  Title  Patient to demo 5/5 left hip and knee strength to normalize ADLS.    Status  Partially Met      PT LONG TERM GOAL #5   Title  Patient able to demonstrate good balance on LLE to prevent falls.    Status  Achieved            Plan - 09/29/18 0935    Clinical Impression Statement  Pt tolerated stair negotiation with less fatigue. Cues needed to sit back with functional squats. Pt was very unstable and required increase tine to complete SL dead lifts. Good strength and ROM with all machine level interventions. Some weakness exposed with standing hip exercises.    Rehab Potential  Excellent    PT Frequency  2x / week    PT Duration  8 weeks    PT Treatment/Interventions  ADLs/Self Care Home Management;Cryotherapy;Electrical Stimulation;Moist Heat;Therapeutic exercise;Balance training;Neuromuscular re-education;Stair training;Gait training;Patient/family education;Manual techniques;Vasopneumatic Device    PT Next Visit Plan  continue progressive LLE functional strengthening and gait. Unable to progress to I gym HEP due to covid       Patient will benefit from skilled therapeutic intervention in order to improve the following deficits and impairments:  Abnormal gait, Pain, Decreased mobility, Decreased range of motion, Decreased strength  Visit Diagnosis: Stiffness of  left hip, not elsewhere classified  Pain in left leg  Difficulty in walking, not elsewhere classified  Other abnormalities of gait and mobility  Muscle weakness (generalized)     Problem List Patient Active Problem List   Diagnosis Date Noted  . Hyperlipemia   . Paresthesia     Scot Jun, PTA 09/29/2018, 9:39 AM  [place] [address] [place], [place], [address] Phone: [phone]   Fax:  [phone]  Name: Peter Stevens MRN: [id number] Date of Birth: 1960-07-27

## 2018-10-06 ENCOUNTER — Other Ambulatory Visit: Payer: Self-pay

## 2018-10-06 ENCOUNTER — Ambulatory Visit: Payer: BC Managed Care – PPO | Admitting: Physical Therapy

## 2018-10-06 ENCOUNTER — Encounter: Payer: Self-pay | Admitting: Physical Therapy

## 2018-10-06 DIAGNOSIS — M79605 Pain in left leg: Secondary | ICD-10-CM

## 2018-10-06 DIAGNOSIS — M6281 Muscle weakness (generalized): Secondary | ICD-10-CM | POA: Diagnosis not present

## 2018-10-06 DIAGNOSIS — R2689 Other abnormalities of gait and mobility: Secondary | ICD-10-CM | POA: Diagnosis not present

## 2018-10-06 DIAGNOSIS — M25652 Stiffness of left hip, not elsewhere classified: Secondary | ICD-10-CM | POA: Diagnosis not present

## 2018-10-06 DIAGNOSIS — R262 Difficulty in walking, not elsewhere classified: Secondary | ICD-10-CM | POA: Diagnosis not present

## 2018-10-06 NOTE — Therapy (Signed)
[place], [place], [address] Phone: [phone]   Fax:  [phone]  Physical Therapy Treatment  Patient Details  Name: Peter Stevens MRN: [id number] Date of Birth: [birth year] Referring Provider (PT): Ainsley Spinner PA-C; Altamese [place] MD   Encounter Date: 10/06/2018  PT End of Session - 10/06/18 0928    Visit Number  20    Date for PT Re-Evaluation  10/23/18    PT Start Time  0847    PT Stop Time  0928    PT Time Calculation (min)  41 min    Activity Tolerance  Patient tolerated treatment well    Behavior During Therapy  [facility] for tasks assessed/performed       Past Medical History:  Diagnosis Date  . Herpes simplex   . Hyperlipemia   . Paresthesia     History reviewed. No pertinent surgical history.  There were no vitals filed for this visit.  Subjective Assessment - 10/06/18 0853    Subjective  "My leg almost feels normal, I can still feel it"    Currently in Pain?  Yes    Pain Score  1     Pain Location  Leg    Pain Orientation  Left                       [facility] Adult PT Treatment/Exercise - 10/06/18 0001      Ambulation/Gait   Stairs  Yes    Gait Comments  3 flights skip a step on the way up; Light jogging in hallway exxentric loas weakness in LLE, pt reprots some Leg pain with heel strike      High Level Balance   High Level Balance Comments  SLS on airex with ball toss x 20      Knee/Hip Exercises: Aerobic   Elliptical  I=15, R=9  x5 min each    Recumbent Bike  L3 x 5 min       Knee/Hip Exercises: Machines for Strengthening   Cybex Knee Extension  20lb 2x15    Cybex Knee Flexion  55lb 2x10    Cybex Leg Press  80lb 3x10, LLE 40lb 2x10      Knee/Hip Exercises: Standing   Forward Step Up  1 set;Both;10 reps;Hand Hold: 0;Step Height: 8"   holding 8lb dumbbells    Walking with Sports Cord  50lb side step x5 each                PT Short Term  Goals - 06/21/18 1405      PT SHORT TERM GOAL #1   Title  Ind with initial HEP    Status  Achieved        PT Long Term Goals - 09/29/18 0934      PT LONG TERM GOAL #1   Title  Pt able to ambulate community distances without AD.    Status  Achieved      PT LONG TERM GOAL #2   Title  Pt to demo left hip WFL to complete ADLS and work functions.    Status  Partially Met      PT LONG TERM GOAL #3   Title  Patient able to perform ADLs with 2/10 pain or less in the left leg.    Status  Partially Met      PT LONG TERM GOAL #4   Title  Patient to demo 5/5 left hip  and knee strength to normalize ADLS.    Status  Partially Met      PT LONG TERM GOAL #5   Title  Patient able to demonstrate good balance on LLE to prevent falls.    Status  Achieved            Plan - 10/06/18 0929    Clinical Impression Statement  Pt is doing well, he reports that's his leg almost feels normal. Progressed to some light jogging in hall way with a little pain with heel strike. Increase weight tolerated on all machine level interventions. Stair negotiation was less taxing on pt. LLE fatigues quick with single leg standing.     PT Treatment/Interventions  ADLs/Self Care Home Management;Cryotherapy;Electrical Stimulation;Moist Heat;Therapeutic exercise;Balance training;Neuromuscular re-education;Stair training;Gait training;Patient/family education;Manual techniques;Vasopneumatic Device    PT Next Visit Plan  continue progressive LLE functional strengthening and gait. Unable to progress to I gym HEP due to covid, 1 more visit       Patient will benefit from skilled therapeutic intervention in order to improve the following deficits and impairments:  Abnormal gait, Pain, Decreased mobility, Decreased range of motion, Decreased strength  Visit Diagnosis: Stiffness of left hip, not elsewhere classified  Pain in left leg  Difficulty in walking, not elsewhere classified  Other abnormalities of gait and  mobility     Problem List Patient Active Problem List   Diagnosis Date Noted  . Hyperlipemia   . Paresthesia     Scot Jun, PTA 10/06/2018, 9:31 AM  [place], [place], [address] Phone: [phone]   Fax:  [phone]  Name: Peter Stevens MRN: [id number] Date of Birth: March 29, 1961

## 2018-10-13 ENCOUNTER — Ambulatory Visit: Payer: BC Managed Care – PPO | Attending: Orthopedic Surgery | Admitting: Physical Therapy

## 2018-10-13 ENCOUNTER — Encounter: Payer: Self-pay | Admitting: Physical Therapy

## 2018-10-13 ENCOUNTER — Other Ambulatory Visit: Payer: Self-pay

## 2018-10-13 DIAGNOSIS — R262 Difficulty in walking, not elsewhere classified: Secondary | ICD-10-CM

## 2018-10-13 DIAGNOSIS — R2689 Other abnormalities of gait and mobility: Secondary | ICD-10-CM

## 2018-10-13 DIAGNOSIS — M25652 Stiffness of left hip, not elsewhere classified: Secondary | ICD-10-CM

## 2018-10-13 DIAGNOSIS — M6281 Muscle weakness (generalized): Secondary | ICD-10-CM | POA: Diagnosis not present

## 2018-10-13 DIAGNOSIS — M79605 Pain in left leg: Secondary | ICD-10-CM

## 2018-10-13 NOTE — Therapy (Signed)
[place], [place], [address] Phone: [phone]   Fax:  [phone]  Physical Therapy Treatment  Patient Details  Name: Peter Stevens MRN: [id number] Date of Birth: 09/20/1960 Referring Provider (PT): Ainsley Spinner PA-C; Altamese [place] MD   Encounter Date: 10/13/2018  PT End of Session - 10/13/18 0930    Visit Number  21    Date for PT Re-Evaluation  10/23/18    PT Start Time  0845    PT Stop Time  0930    PT Time Calculation (min)  45 min    Activity Tolerance  Patient tolerated treatment well    Behavior During Therapy  [facility] for tasks assessed/performed       Past Medical History:  Diagnosis Date  . Herpes simplex   . Hyperlipemia   . Paresthesia     History reviewed. No pertinent surgical history.  There were no vitals filed for this visit.  Subjective Assessment - 10/13/18 0847    Subjective  "It is still not 100% but it is close to normal"    Currently in Pain?  No/denies                       [facility] Adult PT Treatment/Exercise - 10/13/18 0001      Ambulation/Gait   Gait Comments  3 flights skip a step on the way up; Light jogging in hallway exxentric loas weakness in LLE, pt reprots some Leg pain with heel strike      Knee/Hip Exercises: Aerobic   Elliptical  I=17, R=13  x2 min each    Recumbent Bike  L3 x 5 min       Knee/Hip Exercises: Machines for Strengthening   Cybex Knee Extension  25lb 2x15    Cybex Knee Flexion  55lb 2x15    Cybex Leg Press  80lb 2x15, LLE 70lb 2x10      Knee/Hip Exercises: Standing   Functional Squat  1 set;10 reps    Walking with Sports Cord  50lb front and each side x5 with 6 inch step up    Other Standing Knee Exercises  controlled descents 6in 3x10 LLE     Other Standing Knee Exercises  squats on BOSU x10       Knee/Hip Exercises: Seated   Sit to Sand  2 sets;5 reps;without UE support   LLE lowered UBE seat               PT Short Term Goals - 06/21/18 1405      PT SHORT TERM GOAL #1   Title  Ind with initial HEP    Status  Achieved        PT Long Term Goals - 09/29/18 0934      PT LONG TERM GOAL #1   Title  Pt able to ambulate community distances without AD.    Status  Achieved      PT LONG TERM GOAL #2   Title  Pt to demo left hip WFL to complete ADLS and work functions.    Status  Partially Met      PT LONG TERM GOAL #3   Title  Patient able to perform ADLs with 2/10 pain or less in the left leg.    Status  Partially Met      PT LONG TERM GOAL #4   Title  Patient to demo 5/5 left hip and knee strength to  normalize ADLS.    Status  Partially Met      PT LONG TERM GOAL #5   Title  Patient able to demonstrate good balance on LLE to prevent falls.    Status  Achieved            Plan - 10/13/18 0931    Clinical Impression Statement  Pt became very fatigue today. Therapy session was at a faster pace due to pt reports to return to running. Increase weight and or reps tolerated with all machine level exercises. Instability noted with the resisted side steps with step up. Pt was very unstable with squats on BOSU requiring min assist to stabilize at times.     Rehab Potential  Excellent    PT Frequency  2x / week    PT Treatment/Interventions  ADLs/Self Care Home Management;Cryotherapy;Electrical Stimulation;Moist Heat;Therapeutic exercise;Balance training;Neuromuscular re-education;Stair training;Gait training;Patient/family education;Manual techniques;Vasopneumatic Device    PT Next Visit Plan  continue progressive LLE functional strengthening and gait. Unable to progress to I gym HEP due to covid       Patient will benefit from skilled therapeutic intervention in order to improve the following deficits and impairments:  Abnormal gait, Pain, Decreased mobility, Decreased range of motion, Decreased strength  Visit Diagnosis: Stiffness of left hip, not elsewhere  classified  Pain in left leg  Difficulty in walking, not elsewhere classified  Other abnormalities of gait and mobility  Muscle weakness (generalized)     Problem List Patient Active Problem List   Diagnosis Date Noted  . Hyperlipemia   . Paresthesia     Scot Jun, PTA 10/13/2018, 9:36 AM  [place] [address] [place], [place], [address] Phone: [phone]   Fax:  [phone]  Name: Peter Stevens MRN: [id number] Date of Birth: [birth year]

## 2018-10-20 ENCOUNTER — Encounter: Payer: Self-pay | Admitting: Physical Therapy

## 2018-10-20 ENCOUNTER — Other Ambulatory Visit: Payer: Self-pay

## 2018-10-20 ENCOUNTER — Ambulatory Visit: Payer: BC Managed Care – PPO | Admitting: Physical Therapy

## 2018-10-20 DIAGNOSIS — R262 Difficulty in walking, not elsewhere classified: Secondary | ICD-10-CM

## 2018-10-20 DIAGNOSIS — M25652 Stiffness of left hip, not elsewhere classified: Secondary | ICD-10-CM | POA: Diagnosis not present

## 2018-10-20 DIAGNOSIS — M6281 Muscle weakness (generalized): Secondary | ICD-10-CM | POA: Diagnosis not present

## 2018-10-20 DIAGNOSIS — M79605 Pain in left leg: Secondary | ICD-10-CM | POA: Diagnosis not present

## 2018-10-20 DIAGNOSIS — R2689 Other abnormalities of gait and mobility: Secondary | ICD-10-CM | POA: Diagnosis not present

## 2018-10-20 NOTE — Therapy (Signed)
[place], [place], [address] Phone: [phone]   Fax:  [phone]  Physical Therapy Treatment  Patient Details  Name: Peter Stevens MRN: [id number] Date of Birth: 1960/12/02 Referring Provider (PT): Ainsley Spinner PA-C; Altamese [place] MD   Encounter Date: 10/20/2018  PT End of Session - 10/20/18 0938    Visit Number  22    Date for PT Re-Evaluation  10/23/18    PT Start Time  0900    PT Stop Time  0943    PT Time Calculation (min)  43 min    Activity Tolerance  Patient tolerated treatment well    Behavior During Therapy  [facility] for tasks assessed/performed       Past Medical History:  Diagnosis Date  . Herpes simplex   . Hyperlipemia   . Paresthesia     History reviewed. No pertinent surgical history.  There were no vitals filed for this visit.  Subjective Assessment - 10/20/18 0902    Subjective  "I am feeling pretty good, my leg is hurting just a little bit today"    Currently in Pain?  Yes    Pain Score  1     Pain Location  Leg   Quad   Pain Orientation  Left    Pain Descriptors / Indicators  Nagging                       [facility] Adult PT Treatment/Exercise - 10/20/18 0001      Knee/Hip Exercises: Aerobic   Elliptical  I=17, R=13  x2 min each    Recumbent Bike  L3 x 5 min       Knee/Hip Exercises: Machines for Strengthening   Cybex Knee Extension  25lb 2x15, LLE 15lb 2x10     Cybex Knee Flexion  65lb 2x15, LLE 25lb 2x10     Cybex Leg Press  90lb 2x15, LLE 70lb 2x10      Knee/Hip Exercises: Standing   Forward Step Up  Left;2 sets;15 reps;Hand Hold: 0;Step Height: 8"   10lb dumbbells   Functional Squat  10 reps;2 sets;3 seconds   10lb dumbell    Other Standing Knee Exercises  LLE SL DL 5lb x10                PT Short Term Goals - 06/21/18 1405      PT SHORT TERM GOAL #1   Title  Ind with initial HEP    Status  Achieved        PT Long  Term Goals - 09/29/18 0934      PT LONG TERM GOAL #1   Title  Pt able to ambulate community distances without AD.    Status  Achieved      PT LONG TERM GOAL #2   Title  Pt to demo left hip WFL to complete ADLS and work functions.    Status  Partially Met      PT LONG TERM GOAL #3   Title  Patient able to perform ADLs with 2/10 pain or less in the left leg.    Status  Partially Met      PT LONG TERM GOAL #4   Title  Patient to demo 5/5 left hip and knee strength to normalize ADLS.    Status  Partially Met      PT LONG TERM GOAL #5   Title  Patient able to demonstrate  good balance on LLE to prevent falls.    Status  Achieved            Plan - 10/20/18 0939    Clinical Impression Statement  More fatigue throughout today's session. Tolerated SL strength with LLE well. Increase bilat weight tolerated on leg press. No reports of increase pain. Attempted 20lb SL extensions pt was able to do a few reps before lighter load needed. Instability with SL dead lifts remain but some improvement. He reports  at times his LLE feels normal.    Rehab Potential  Excellent    PT Duration  8 weeks    PT Treatment/Interventions  ADLs/Self Care Home Management;Cryotherapy;Electrical Stimulation;Moist Heat;Therapeutic exercise;Balance training;Neuromuscular re-education;Stair training;Gait training;Patient/family education;Manual techniques;Vasopneumatic Device    PT Next Visit Plan  continue progressive LLE functional strengthening and gait. Unable to progress to I gym HEP due to covid, Possible D/C next visit.       Patient will benefit from skilled therapeutic intervention in order to improve the following deficits and impairments:  Abnormal gait, Pain, Decreased mobility, Decreased range of motion, Decreased strength  Visit Diagnosis: Pain in left leg  Stiffness of left hip, not elsewhere classified  Difficulty in walking, not elsewhere classified     Problem List Patient Active Problem  List   Diagnosis Date Noted  . Hyperlipemia   . Paresthesia     Scot Jun, PTA 10/20/2018, 9:41 AM  [place], [place], [address] Phone: [phone]   Fax:  [phone]  Name: YOSHUA GEISINGER MRN: [id number] Date of Birth: [birth year]

## 2018-10-26 ENCOUNTER — Encounter: Payer: Self-pay | Admitting: Physical Therapy

## 2018-10-26 ENCOUNTER — Ambulatory Visit: Payer: BC Managed Care – PPO | Admitting: Physical Therapy

## 2018-10-26 ENCOUNTER — Other Ambulatory Visit: Payer: Self-pay

## 2018-10-26 DIAGNOSIS — M25652 Stiffness of left hip, not elsewhere classified: Secondary | ICD-10-CM | POA: Diagnosis not present

## 2018-10-26 DIAGNOSIS — M6281 Muscle weakness (generalized): Secondary | ICD-10-CM

## 2018-10-26 DIAGNOSIS — M79605 Pain in left leg: Secondary | ICD-10-CM

## 2018-10-26 DIAGNOSIS — R262 Difficulty in walking, not elsewhere classified: Secondary | ICD-10-CM | POA: Diagnosis not present

## 2018-10-26 DIAGNOSIS — R2689 Other abnormalities of gait and mobility: Secondary | ICD-10-CM

## 2018-10-26 NOTE — Therapy (Signed)
[address] [place], [place], [address] Phone: [phone]   Fax:  [phone]  Physical Therapy Treatment  Patient Details  Name: Peter Stevens MRN: [id number] Date of Birth: [birth year] Referring Provider (PT): Ainsley Spinner PA-C; Altamese Alligator MD   Encounter Date: 10/26/2018    Past Medical History:  Diagnosis Date  . Herpes simplex   . Hyperlipemia   . Paresthesia     History reviewed. No pertinent surgical history.  There were no vitals filed for this visit.  Subjective Assessment - 10/26/18 0900    Subjective  "Feeling good, it has been a little sore the last couple days"    Currently in Pain?  Yes    Pain Score  1     Pain Location  Leg   quad   Pain Orientation  Left                       [facility] Adult PT Treatment/Exercise - 10/26/18 0001      Ambulation/Gait   Gait Comments  2 flights of stairs skip a step on way up, 3 trials on ladder, hall way joging      Knee/Hip Exercises: Aerobic   Elliptical  I=17, R=9  x3 min each    Recumbent Bike  L3 x 5 min       Knee/Hip Exercises: Machines for Strengthening   Cybex Knee Extension  25lb 2x15, LLE 15lb 2x10     Cybex Knee Flexion  65lb 2x15, LLE 25lb 2x10     Cybex Leg Press  90lb x15, 100lb 2x15, LLE 80lb 2x10      Knee/Hip Exercises: Standing   Heel Raises  15 reps;2 seconds;Both;2 sets    Other Standing Knee Exercises  controlled descents 8in 2x10 LLE                PT Short Term Goals - 06/21/18 1405      PT SHORT TERM GOAL #1   Title  Ind with initial HEP    Status  Achieved        PT Long Term Goals - 10/26/18 0944      PT LONG TERM GOAL #3   Title  Patient able to perform ADLs with 2/10 pain or less in the left leg.    Status  Achieved      PT LONG TERM GOAL #4   Title  Patient to demo 5/5 left hip and knee strength to normalize ADLS.    Status  Achieved      PT LONG TERM GOAL #5   Title   Patient able to demonstrate good balance on LLE to prevent falls.    Status  Achieved            Plan - 10/26/18 0944    Clinical Impression Statement  Most goals met, pt reports no functional limitations at home. He performed all of today's interventions well. He dis have some fatigue with the increase in resistance on machines. Slight limp when running due to eccentric weakness with LLE.    Rehab Potential  Excellent    PT Frequency  2x / week    PT Treatment/Interventions  ADLs/Self Care Home Management;Cryotherapy;Electrical Stimulation;Moist Heat;Therapeutic exercise;Balance training;Neuromuscular re-education;Stair training;Gait training;Patient/family education;Manual techniques;Vasopneumatic Device    PT Next Visit Plan  Will place pt on 2 wk hold. Pt will call within 2 weeks if he notices a delcine in function.  Patient will benefit from skilled therapeutic intervention in order to improve the following deficits and impairments:  Abnormal gait, Pain, Decreased mobility, Decreased range of motion, Decreased strength  Visit Diagnosis: 1. Pain in left leg   2. Stiffness of left hip, not elsewhere classified   3. Difficulty in walking, not elsewhere classified   4. Other abnormalities of gait and mobility   5. Muscle weakness (generalized)        Problem List Patient Active Problem List   Diagnosis Date Noted  . Hyperlipemia   . Paresthesia     Scot Jun, PTA 10/26/2018, 9:46 AM  [address] [place], [place], [address] Phone: [phone]   Fax:  [phone]  Name: Peter Stevens MRN: [id number] Date of Birth: 1961-01-11

## 2019-01-17 DIAGNOSIS — R739 Hyperglycemia, unspecified: Secondary | ICD-10-CM | POA: Diagnosis not present

## 2019-01-17 DIAGNOSIS — Z23 Encounter for immunization: Secondary | ICD-10-CM | POA: Diagnosis not present

## 2019-01-17 DIAGNOSIS — Z0000 Encounter for general adult medical examination without abnormal findings: Secondary | ICD-10-CM | POA: Diagnosis not present

## 2019-01-17 DIAGNOSIS — E7800 Pure hypercholesterolemia, unspecified: Secondary | ICD-10-CM | POA: Diagnosis not present

## 2019-03-22 DIAGNOSIS — Z23 Encounter for immunization: Secondary | ICD-10-CM | POA: Diagnosis not present

## 2019-07-21 ENCOUNTER — Ambulatory Visit: Payer: BC Managed Care – PPO | Attending: Internal Medicine

## 2019-07-21 DIAGNOSIS — Z23 Encounter for immunization: Secondary | ICD-10-CM

## 2019-07-21 NOTE — Progress Notes (Signed)
   Covid-19 Vaccination Clinic  Name:  MAKAY SCHEIDT    MRN: [id number] DOB: [birth year]  07/21/2019  Mr. Agne was observed post Covid-19 immunization for 15 minutes without incident. He was provided with Vaccine Information Sheet and instruction to access the V-Safe system.   Mr. Coriell was instructed to call 911 with any severe reactions post vaccine: Marland Kitchen Difficulty breathing  . Swelling of face and throat  . A fast heartbeat  . A bad rash all over body  . Dizziness and weakness   Immunizations Administered    Name Date Dose VIS Date Route   Moderna COVID-19 Vaccine 07/21/2019  9:55 AM 0.5 mL 04/11/2019 Intramuscular   Manufacturer: Moderna   Lot: [id number]   [id number]

## 2019-08-22 ENCOUNTER — Ambulatory Visit: Payer: BC Managed Care – PPO | Attending: Internal Medicine

## 2019-08-22 DIAGNOSIS — Z23 Encounter for immunization: Secondary | ICD-10-CM

## 2019-08-22 NOTE — Progress Notes (Signed)
   Covid-19 Vaccination Clinic  Name:  Peter Stevens    MRN: [id number] DOB: 06-Feb-1961  08/22/2019  [name] was observed post Covid-19 immunization for 15 minutes without incident. He was provided with Vaccine Information Sheet and instruction to access the V-Safe system.   [name] was instructed to call 911 with any severe reactions post vaccine: Marland Kitchen Difficulty breathing  . Swelling of face and throat  . A fast heartbeat  . A bad rash all over body  . Dizziness and weakness   Immunizations Administered    Name Date Dose VIS Date Route   Moderna COVID-19 Vaccine 08/22/2019 11:14 AM 0.5 mL 04/11/2019 Intramuscular   Manufacturer: Moderna   LotHQ:[id number]   [place]DW:[id number]

## 2019-09-19 DIAGNOSIS — H2513 Age-related nuclear cataract, bilateral: Secondary | ICD-10-CM | POA: Diagnosis not present

## 2020-05-11 DIAGNOSIS — Z20828 Contact with and (suspected) exposure to other viral communicable diseases: Secondary | ICD-10-CM | POA: Diagnosis not present

## 2021-01-15 DIAGNOSIS — R030 Elevated blood-pressure reading, without diagnosis of hypertension: Secondary | ICD-10-CM | POA: Diagnosis not present

## 2021-01-15 DIAGNOSIS — R739 Hyperglycemia, unspecified: Secondary | ICD-10-CM | POA: Diagnosis not present

## 2021-01-15 DIAGNOSIS — Z0000 Encounter for general adult medical examination without abnormal findings: Secondary | ICD-10-CM | POA: Diagnosis not present

## 2021-01-15 DIAGNOSIS — R972 Elevated prostate specific antigen [PSA]: Secondary | ICD-10-CM | POA: Diagnosis not present

## 2021-01-15 DIAGNOSIS — Z125 Encounter for screening for malignant neoplasm of prostate: Secondary | ICD-10-CM | POA: Diagnosis not present

## 2021-01-15 DIAGNOSIS — N529 Male erectile dysfunction, unspecified: Secondary | ICD-10-CM | POA: Diagnosis not present

## 2021-01-15 DIAGNOSIS — E7800 Pure hypercholesterolemia, unspecified: Secondary | ICD-10-CM | POA: Diagnosis not present

## 2021-05-20 DIAGNOSIS — R972 Elevated prostate specific antigen [PSA]: Secondary | ICD-10-CM | POA: Diagnosis not present

## 2021-09-17 DIAGNOSIS — R972 Elevated prostate specific antigen [PSA]: Secondary | ICD-10-CM | POA: Diagnosis not present

## 2021-09-17 DIAGNOSIS — R3912 Poor urinary stream: Secondary | ICD-10-CM | POA: Diagnosis not present

## 2021-09-17 DIAGNOSIS — N401 Enlarged prostate with lower urinary tract symptoms: Secondary | ICD-10-CM | POA: Diagnosis not present

## 2021-09-17 DIAGNOSIS — N5201 Erectile dysfunction due to arterial insufficiency: Secondary | ICD-10-CM | POA: Diagnosis not present

## 2021-09-18 ENCOUNTER — Other Ambulatory Visit: Payer: Self-pay | Admitting: Urology

## 2021-09-18 DIAGNOSIS — R972 Elevated prostate specific antigen [PSA]: Secondary | ICD-10-CM

## 2021-10-14 ENCOUNTER — Ambulatory Visit
Admission: RE | Admit: 2021-10-14 | Discharge: 2021-10-14 | Disposition: A | Discharge status: Home / Self Care | Payer: BC Managed Care – PPO | Source: Ambulatory Visit | Attending: Urology | Admitting: Urology

## 2021-10-14 DIAGNOSIS — R590 Localized enlarged lymph nodes: Secondary | ICD-10-CM | POA: Diagnosis not present

## 2021-10-14 DIAGNOSIS — R972 Elevated prostate specific antigen [PSA]: Secondary | ICD-10-CM | POA: Diagnosis not present

## 2021-10-14 MED ORDER — GADOBENATE DIMEGLUMINE 529 MG/ML IV SOLN
18.0000 mL | Freq: Once | INTRAVENOUS | Status: AC | PRN
  Administered 2021-10-14: 18 mL via INTRAVENOUS

## 2021-10-20 DIAGNOSIS — C61 Malignant neoplasm of prostate: Secondary | ICD-10-CM | POA: Diagnosis not present

## 2021-10-20 DIAGNOSIS — R972 Elevated prostate specific antigen [PSA]: Secondary | ICD-10-CM | POA: Diagnosis not present

## 2021-11-03 ENCOUNTER — Other Ambulatory Visit (HOSPITAL_COMMUNITY): Payer: Self-pay | Admitting: Urology

## 2021-11-03 DIAGNOSIS — C61 Malignant neoplasm of prostate: Secondary | ICD-10-CM

## 2021-11-06 ENCOUNTER — Encounter (HOSPITAL_COMMUNITY): Payer: BC Managed Care – PPO

## 2021-11-10 ENCOUNTER — Encounter (HOSPITAL_COMMUNITY)
Admission: RE | Admit: 2021-11-10 | Discharge: 2021-11-10 | Disposition: A | Discharge status: Home / Self Care | Payer: BC Managed Care – PPO | Source: Ambulatory Visit | Attending: Urology | Admitting: Urology

## 2021-11-10 DIAGNOSIS — I517 Cardiomegaly: Secondary | ICD-10-CM | POA: Diagnosis not present

## 2021-11-10 DIAGNOSIS — I700 Atherosclerosis of aorta: Secondary | ICD-10-CM | POA: Diagnosis not present

## 2021-11-10 DIAGNOSIS — C61 Malignant neoplasm of prostate: Secondary | ICD-10-CM | POA: Diagnosis not present

## 2021-11-10 DIAGNOSIS — K4020 Bilateral inguinal hernia, without obstruction or gangrene, not specified as recurrent: Secondary | ICD-10-CM | POA: Diagnosis not present

## 2021-11-10 MED ORDER — PIFLIFOLASTAT F 18 (PYLARIFY) INJECTION
9.0000 | Freq: Once | INTRAVENOUS | Status: AC
  Administered 2021-11-10: 8.2 via INTRAVENOUS

## 2021-11-14 ENCOUNTER — Other Ambulatory Visit: Payer: BC Managed Care – PPO

## 2021-11-19 ENCOUNTER — Other Ambulatory Visit: Payer: Self-pay | Admitting: Internal Medicine

## 2021-11-19 DIAGNOSIS — E7800 Pure hypercholesterolemia, unspecified: Secondary | ICD-10-CM

## 2021-11-25 NOTE — Progress Notes (Signed)
I called pt to introduce myself as the Prostate Nurse Navigator and the Coordinator of the Prostate [place].   1. I confirmed with the patient he is aware of his referral to the clinic 8/1, arriving @ 12:30 pm.    2. I discussed the format of the clinic and the physicians he will be seeing that day.   3. I discussed where the clinic is located and how to contact me.   4. I confirmed his address and informed him I would be mailing a packet of information and forms to be completed. I asked him to bring them with him the day of his appointment.    He voiced understanding of the above. I asked him to call me if he has any questions or concerns regarding his appointments or the forms he needs to complete.

## 2021-12-09 ENCOUNTER — Other Ambulatory Visit: Payer: Self-pay

## 2021-12-09 ENCOUNTER — Inpatient Hospital Stay: Payer: BC Managed Care – PPO | Attending: Oncology

## 2021-12-09 ENCOUNTER — Inpatient Hospital Stay (HOSPITAL_BASED_OUTPATIENT_CLINIC_OR_DEPARTMENT_OTHER): Payer: BC Managed Care – PPO | Admitting: Oncology

## 2021-12-09 ENCOUNTER — Ambulatory Visit
Admission: RE | Admit: 2021-12-09 | Discharge: 2021-12-09 | Disposition: A | Discharge status: Home / Self Care | Payer: BC Managed Care – PPO | Source: Ambulatory Visit | Attending: Radiation Oncology | Admitting: Radiation Oncology

## 2021-12-09 ENCOUNTER — Other Ambulatory Visit: Payer: Self-pay | Admitting: Genetic Counselor

## 2021-12-09 ENCOUNTER — Inpatient Hospital Stay (HOSPITAL_BASED_OUTPATIENT_CLINIC_OR_DEPARTMENT_OTHER): Payer: BC Managed Care – PPO | Admitting: Genetic Counselor

## 2021-12-09 DIAGNOSIS — Z8042 Family history of malignant neoplasm of prostate: Secondary | ICD-10-CM

## 2021-12-09 DIAGNOSIS — C61 Malignant neoplasm of prostate: Secondary | ICD-10-CM

## 2021-12-09 DIAGNOSIS — Z1379 Encounter for other screening for genetic and chromosomal anomalies: Secondary | ICD-10-CM

## 2021-12-09 DIAGNOSIS — Z191 Hormone sensitive malignancy status: Secondary | ICD-10-CM | POA: Diagnosis not present

## 2021-12-09 DIAGNOSIS — Z8546 Personal history of malignant neoplasm of prostate: Secondary | ICD-10-CM | POA: Diagnosis not present

## 2021-12-09 LAB — GENETIC SCREENING ORDER

## 2021-12-09 NOTE — Consult Note (Signed)
[facility]     12/09/2021   --------------------------------------------------------------------------------   Christena Deem. Kielbasa  MRN: [id number]  DOB: February 03, 1961, 61 year old Male  SSN:    PRIMARY CARE:    REFERRING:  Delanna Ahmadi, MD  PROVIDER:  Irine Seal, M.D.  TREATING:  Raynelle Bring, M.D.  LOCATION:  [facility] [phone]     --------------------------------------------------------------------------------   CC/HPI: CC: Prostate Cancer   Physician requesting consult: Dr. Irine Seal  PCP: Dr. Lavone Orn  Location of consult: [facility]   Mr. Heuer is a 61 year old healthy gentleman who was found to have a persistently elevated PSA of 5.15 prompting an MRI of the prostate on 10/14/21. This indicated a 1.2 cm PI-RADS 4 lesion of the right posterolateral peripheral zone, a 7 mm PI-RADS 3 lesion of the left posterolateral peripheral zone at the base, and a 1.5 cm PI-RADS 3 left anterior mid/apical lesion. An MR/US fusion biopsy was performed on 10/20/21 and this confirmed Gleason 4+3=7 adenocarcinoma with 9 out of 12 systematic cores positive for malignancy and 5 out of 7 targeted biopsies positive for a total of 14 out of 19 cores positive. A PSMA PET scan was performed on 11/12/21 for stating and demonstrated an isolated area of uptake in a left external iliac lymph node.   Family history: Father was diagnosed in his 34s and treated with XRT.   Imaging studies: PSMA PET scan (11/12/21) - low level (SUV 2.8) solitary left external iliac lymph node uptake   PMH: He has a history of GERD.  PSH: No abdominal surgeries.   TNM stage: cT1c N1 Mx  PSA: 5.15  Gleason score: 4+3=7 (GG 3)  Biopsy (10/20/21): 14/19 cores positive  Left: L lateral apex (5%, 3+3=6), L lateral base (20%, 3+4=7), L base (30%, 4+3=7)  Right: R apex (40%, 3+4=7), R lateral apex (5%, 3+3=6), R mid (5%, 3+3=6), R  lateral mid (30%, 3+3=6), R base (5%, 3+3=6), R lateral base (20%, 3+3=6)  ROI - 1: 3/3 cores (40%, 40%, 40%, 3+4=7)  ROI - 2: 2/2 cores (5%, 4+3=7 and 5%, 3+4=7)  ROI - 3: Benign  Prostate volume: 22.2 cc   Urinary function: IPSS is 5.  Erectile function: SHIM score is 10. He has recently been using sildenafil up to 75 mg with mild to modest improvements but still not regular and reliable erections.     ALLERGIES: Penicillin    MEDICATIONS: Levofloxacin 750 mg tablet 1 po 1 hour prior to the procedure  Pepcid     GU PSH: Prostate Needle Biopsy - 10/20/2021       PSH Notes: Broken femur. rad inserted- 2020   NON-GU PSH: Surgical Pathology, Gross And Microscopic Examination For Prostate Needle - 10/20/2021     GU PMH: Abnormal radiologic findings on diagnostic imaging of other urinary organs - 10/20/2021 Elevated PSA - 10/20/2021, He has an elevated PSA that occurred after a bout of COVID. He had only slight improvement on a f/u level in January. His exam is benign. I will repeat the PSA today and if still up, he will have an MRI of the prostate and then return for an MR fusion biopsy if positive and a standard biopsy if negative. I reviewed the procedure and risks including bleeding, infection and voiding difficulty. , - 09/17/2021 BPH w/LUTS - 09/17/2021 ED due to arterial insufficiency, He is only having a partial response to the '20mg'$  sildenafil.  I let him know he could use up to '100mg'$  if needed. - 09/17/2021 Weak Urinary Stream - 09/17/2021    NON-GU PMH: GERD Herpesviral infection, unspecified    FAMILY HISTORY: Dementia - Mother Heart Attack - Father High Blood Pressure - Father Prostate Cancer - Father   SOCIAL HISTORY: Marital Status: Single Preferred Language: English; Ethnicity: Not Hispanic Or Latino; Race: White Current Smoking Status: Patient has never smoked.   Tobacco Use Assessment Completed: Used Tobacco in last 30 days? Does not use smokeless tobacco. Drinks  1 drink per week.  Does not use drugs. Drinks 2 caffeinated drinks per day. Patient's occupation Contractor.[employer]    REVIEW OF SYSTEMS:    GU Review Male:   Patient denies frequent urination, hard to postpone urination, burning/ pain with urination, get up at night to urinate, leakage of urine, stream starts and stops, trouble starting your streams, and have to strain to urinate .  Gastrointestinal (Lower):   Patient denies diarrhea and constipation.  Gastrointestinal (Upper):   Patient denies nausea and vomiting.  Constitutional:   Patient denies fever, night sweats, weight loss, and fatigue.  Skin:   Patient denies skin rash/ lesion and itching.  Eyes:   Patient denies blurred vision and double vision.  Ears/ Nose/ Throat:   Patient denies sore throat and sinus problems.  Hematologic/Lymphatic:   Patient denies swollen glands and easy bruising.  Cardiovascular:   Patient denies leg swelling and chest pains.  Respiratory:   Patient denies cough and shortness of breath.  Endocrine:   Patient denies excessive thirst.  Musculoskeletal:   Patient denies back pain and joint pain.  Neurological:   Patient denies headaches and dizziness.  Psychologic:   Patient denies depression and anxiety.   VITAL SIGNS: None   GU PHYSICAL EXAMINATION:    Prostate: Prostate about 30 grams. There is some mild induration on the right side of the prostate with no evidence of extraprostatic extension. Normal left side of the prostate.   MULTI-SYSTEM PHYSICAL EXAMINATION:    Constitutional: Well-nourished. No physical deformities. Normally developed. Good grooming.  Respiratory: No labored breathing, no use of accessory muscles.   Cardiovascular: Normal temperature, normal extremity pulses, no swelling, no varicosities.  Gastrointestinal: No mass, no tenderness, no rigidity, non obese abdomen.     Complexity of Data:  Lab Test Review:   PSA  Records Review:   Pathology Reports, Previous Patient  Records  X-Ray Review: PET Scan: Reviewed Films.     09/17/21 05/19/21 01/15/21  PSA  Total PSA 5.15 ng/mL 5.93 ng/ml 6.54 ng/ml    PROCEDURES: None   ASSESSMENT:      ICD-10 Details  1 GU:   Prostate Cancer - C61    PLAN:           Document Letter(s):  Created for Patient: Clinical Summary         Notes:   1. Unfavorable intermediate risk prostate cancer with possible solitary lymph node involvement: I had a detailed discussion with Mr. Manning and his wife today regarding his cancer diagnosis. He has seen Dr. Tammi Klippel and Dr. Alen Blew earlier this afternoon and we have reviewed both his pathology and his imaging studies in conference prior to clinic today.   The patient was counseled about the natural history of prostate cancer and the standard treatment options that are available for prostate cancer. It was explained to him how his age and life expectancy, clinical stage, Gleason score/prognostic grade group, and PSA (and PSA  density) affect his prognosis, the decision to proceed with additional staging studies, as well as how that information influences recommended treatment strategies. We discussed the roles for active surveillance, radiation therapy, surgical therapy, androgen deprivation, as well as ablative therapy and other investigational options for the treatment of prostate cancer as appropriate to his individual cancer situation. We discussed the risks and benefits of these options with regard to their impact on cancer control and also in terms of potential adverse events, complications, and impact on quality of life particularly related to urinary and sexual function. The patient was encouraged to ask questions throughout the discussion today and all questions were answered to his stated satisfaction. In addition, the patient was provided with and/or directed to appropriate resources and literature for further education about prostate cancer and treatment options. We discussed  surgical therapy for prostate cancer including the different available surgical approaches. We discussed, in detail, the risks and expectations of surgery with regard to cancer control, urinary control, and erectile function as well as the expected postoperative recovery process. Additional risks of surgery including but not limited to bleeding, infection, hernia formation, nerve damage, lymphocele formation, bowel/rectal injury potentially necessitating colostomy, damage to the urinary tract resulting in urine leakage, urethral stricture, and the cardiopulmonary risks such as myocardial infarction, stroke, death, venothromboembolism, etc. were explained. The risk of open surgical conversion for robotic/laparoscopic prostatectomy was also discussed.   He understands the high risk nature of his situation considering the potential left-sided pelvic lymph node involvement. He is going to consider his options further with his wife and whether he wants to proceed with primary surgical therapy or short-term androgen deprivation therapy with radiation therapy. Regardless, although his lymph node has concerns for potentially being equivocal based on his low-level uptake, he understands that this would be addressed with surgical therapy or radiation therapy regardless.   If he elect surgical treatment, my tentative plan is to perform a bilateral nerve sparing robot-assisted laparoscopic radical prostatectomy and bilateral pelvic lymphadenectomy with attention to the concerning left external iliac lymph node. Based on the induration noted on the right side of the prostate, I would consider a partial right side nerve spare. We also discussed the option of doing a wide resection but he is still inclined to proceed with nerve sparing understanding the low risk of regaining erectile function postoperatively when taking into account his preoperative dysfunction.   CC: Dr. Lavone Orn  Dr. Tyler Pita  Dr. Zola Button   Dr. Irine Seal        Next Appointment:      Next Appointment: 12/22/2021 02:15 PM    Appointment Type: Office Visit Established Patient    Location: [facility] [phone]    Provider: Irine Seal, M.D.    Reason for Visit: Next ava OV/PET RESULTS      E & M CODES: We spent 57 minutes dedicated to evaluation and management time, including face to face interaction, discussions on coordination of care, documentation, result review, and discussion with others as applicable.

## 2021-12-09 NOTE — Progress Notes (Signed)
Radiation Oncology         [phone] ________________________________  Multidisciplinary Prostate Cancer Clinic  Initial Radiation Oncology Consultation  Name: JARET COPPEDGE MRN: [id number]  Date: 12/09/2021  DOB: [birth year]  IW:PYKDXIP, Jenny Reichmann, MD  Irine Seal, MD   REFERRING PHYSICIAN: Irine Seal, MD  DIAGNOSIS: 61 y.o. gentleman with stage T1c adenocarcinoma of the prostate with a Gleason's score of 4+3 and a PSA of 5.15    ICD-10-CM   1. Malignant neoplasm of prostate ([place])  C61       HISTORY OF PRESENT ILLNESS::Kemauri B Rudie is a 61 y.o. gentleman.  He was noted to have an elevated PSA of 6.54 on routine labs by his primary care physician, Dr. Laurann Montana in September 2022.  A repeat PSA on 05/19/2021 remained elevated at 5.93.  Accordingly, he was referred for evaluation in urology by Dr. Jeffie Pollock on 09/17/2021,  digital rectal examination was performed at that time revealing no nodularity or concerning findings.  The PSA was repeated that same day and remained elevated at 5.15.  Therefore, a prostate MRI was performed 10/14/2021 and showed a PI-RADS 4 lesion in the right posterior lateral peripheral zone, a PI-RADS 3 lesion in the left posterior lateral peripheral zone and a PI-RADS 3 lesion in the right anterior transitional zone.  The patient proceeded to MRI fusion transrectal ultrasound with 19 biopsies of the prostate on 10/20/2021.  The prostate volume measured 22.2 cc.  Out of 19 core biopsies, 14 were positive.  The maximum Gleason score was 4+3, and this was seen in the left base and 1 of 2 cores from MRI ROI #2 in the left posterior lateral peripheral zone.  Additionally, Gleason 3+4 was seen in the other core biopsy from MRI ROI #2, 3 of 3 cores from MRI ROI #1 as well as the left base lateral and right apex.  All other positive cores contained Gleason 3+3.        A PSMA PET scan was performed on 11/10/2021 for disease staging and showed primarily localized disease in the  prostate but did note an equivocal left external iliac node favored to be nodal metastasis.  Otherwise, no other evidence of metastatic disease.  The patient reviewed the biopsy results with his urologist and he has kindly been referred today to the multidisciplinary prostate cancer clinic for presentation of pathology and radiology studies in our conference for discussion of potential radiation treatment options and clinical evaluation.  PREVIOUS RADIATION THERAPY: No  PAST MEDICAL HISTORY:  has a past medical history of Herpes simplex, Hyperlipemia, and Paresthesia.    PAST SURGICAL HISTORY:No past surgical history on file.  FAMILY HISTORY: family history includes Heart attack in his father; Prostate cancer in his father.  SOCIAL HISTORY:  reports that he has never smoked. He has never used smokeless tobacco. He reports current alcohol use of about 4.0 - 5.0 standard drinks of alcohol per week. He reports that he does not use drugs.  ALLERGIES: Lipitor [atorvastatin] and Penicillins  MEDICATIONS:  Current Outpatient Medications  Medication Sig Dispense Refill   ezetimibe (ZETIA) 10 MG tablet Take 10 mg by mouth daily.     gabapentin (NEURONTIN) 300 MG capsule Take 300 mg by mouth 3 (three) times daily.     HYDROcodone-acetaminophen (NORCO/VICODIN) 5-325 MG tablet Take 1 tablet by mouth every 6 (six) hours as needed for moderate pain.     vitamin B-12 (CYANOCOBALAMIN) 1000 MCG tablet Take 1,000 mcg by mouth daily.  No current facility-administered medications for this visit.    REVIEW OF SYSTEMS:  On review of systems, the patient reports that he is doing well overall. He denies any chest pain, shortness of breath, cough, fevers, chills, night sweats, unintended weight changes. He denies any bowel disturbances, and denies abdominal pain, nausea or vomiting. He denies any new musculoskeletal or joint aches or pains. His IPSS was 5, indicating mild urinary symptoms. His SHIM was 10,  indicating he has moderate erectile dysfunction. A complete review of systems is obtained and is otherwise negative.   PHYSICAL EXAM:  Wt Readings from Last 3 Encounters:  02/13/13 185 lb (83.9 kg)   Temp Readings from Last 3 Encounters:  No data found for Temp   BP Readings from Last 3 Encounters:  02/13/13 (!) 147/94   Pulse Readings from Last 3 Encounters:  02/13/13 62    /10  In general this is a well appearing Caucasian male in no acute distress.  He's alert and oriented x4 and appropriate throughout the examination. Cardiopulmonary assessment is negative for acute distress and he exhibits normal effort.    KPS = 100  100 - Normal; no complaints; no evidence of disease. 90   - Able to carry on normal activity; minor signs or symptoms of disease. 80   - Normal activity with effort; some signs or symptoms of disease. 43   - Cares for self; unable to carry on normal activity or to do active work. 60   - Requires occasional assistance, but is able to care for most of his personal needs. 50   - Requires considerable assistance and frequent medical care. 71   - Disabled; requires special care and assistance. 84   - Severely disabled; hospital admission is indicated although death not imminent. 44   - Very sick; hospital admission necessary; active supportive treatment necessary. 10   - Moribund; fatal processes progressing rapidly. 0     - Dead  Karnofsky DA, Abelmann WH, Craver LS and Burchenal JH [phone]) The use of the nitrogen mustards in the palliative treatment of carcinoma: with particular reference to bronchogenic carcinoma Cancer 1 634-56   LABORATORY DATA:  No results found for: "WBC", "HGB", "HCT", "MCV", "PLT" No results found for: "NA", "K", "CL", "CO2" No results found for: "ALT", "AST", "GGT", "ALKPHOS", "BILITOT"   RADIOGRAPHY: NM PET (PSMA) SKULL TO MID THIGH  Result Date: 11/12/2021 CLINICAL DATA:  New diagnosis of prostate cancer.  PSA of 5.2. EXAM: NUCLEAR  MEDICINE PET SKULL BASE TO THIGH TECHNIQUE: 8.2 mCi F18 Piflufolastat (Pylarify) was injected intravenously. Full-ring PET imaging was performed from the skull base to thigh after the radiotracer. CT data was obtained and used for attenuation correction and anatomic localization. COMPARISON:  Prostate MRI 10/14/2021.  Abdominopelvic CT 08/01/2014. FINDINGS: NECK No radiotracer activity in neck lymph nodes. Incidental CT finding: No cervical adenopathy. CHEST No radiotracer accumulation within mediastinal or hilar lymph nodes. Incidental CT finding: Mild cardiomegaly. Aortic atherosclerosis. Mild right hemidiaphragm elevation. ABDOMEN/PELVIS Prostate: Multifocal prostatic tracer affinity, most significant within the posterior midgland, slightly greater right than left at a S.U.V. max of 5.5. Lymph nodes: A left external iliac node measures 6 mm and a S.U.V. max of 2.8 on 203/4. Liver: No evidence of liver metastasis Incidental CT finding: Abdominal aortic atherosclerosis. Normal adrenal glands. Normal noncontrast appearance of the liver, spleen, stomach, pancreas, kidneys, urinary bladder. Tiny bilateral fat containing inguinal hernias. SKELETON No focal  activity to suggest skeletal metastasis. Left proximal femur  fixation. Degenerative partial fusion of bilateral sacroiliac joints. IMPRESSION: 1. Heterogeneous prostatic tracer affinity, likely site or sites of primary. 2. Mildly tracer avid left external iliac node is equivocal but slightly favored to represent nodal metastasis. 3. No extrapelvic or osseous tracer avid metastasis identified. Electronically Signed   By: Abigail Miyamoto M.D.   On: 11/12/2021 10:25      IMPRESSION/PLAN: 61 y.o. gentleman with Stage T1c adenocarcinoma of the prostate with a Gleason score of 4+3 and a PSA of 5.15.    We discussed the patient's workup and outlined the nature of prostate cancer in this setting. The patient's T stage, Gleason's score, and PSA put him into the unfavorable  intermediate risk group. Accordingly, he is eligible for a variety of potential treatment options including prostatectomy or ST-ADT in combination with either 8 weeks of external radiation, or 5 weeks of external radiation with an upfront brachytherapy boost. We discussed the available radiation techniques, and focused on the details and logistics of delivery. The patient is not an ideal candidate for brachytherapy boost with a prostate volume of 22.2 cc prior to initiating hormone therapy. We discussed that based on his small prostate volume now and the further downsizing caused by ADT, the prostate would be too small to get an adequate amount of radiation dose to successfully treat his cancer.  Therefore, we discussed and outlined the risks, benefits, short and long-term effects associated with external beam radiotherapy and compared and contrasted these with prostatectomy.  We would plan to treat the prostate and pelvic lymph nodes with a boost to the left external iliac node that was equivocal on his recent PSMA PET scan.  We discussed the role of SpaceOAR gel in reducing the rectal toxicity associated with radiotherapy. We also detailed the role of ADT in the treatment of unfavorable intermediate risk prostate cancer and outlined the associated side effects that could be expected with this therapy. He appears to have a good understanding of his disease and our treatment recommendations which are of curative intent.  He was encouraged to ask questions that were answered to his stated satisfaction.  At the end of the conversation the patient is undecided regarding his final treatment preference and prefers to take some additional time to consider his options before committing to a specific treatment.  He has our contact information and will let us know if he ultimately decides to proceed with radiation and we will move forward with treatment planning accordingly at that time.  We enjoyed meeting with him and  his wife today and look forward to following his progress.  They know that they are welcome to call at anytime with any questions or concerns related to radiation.   We personally spent 60 minutes in this encounter including chart review, reviewing radiological studies, meeting face-to-face with the patient, entering orders and completing documentation.    Nicholos Johns, PA-C    Tyler Pita, MD  [facility] Direct Dial: [phone]  Fax: [phone] [email or web address]  Skype  [email or web address]

## 2021-12-09 NOTE — Progress Notes (Addendum)
                               Care Plan Summary  Name: Peter Stevens  DOB: September 04, 1960   Your Medical Team:   Urologist -  Dr. Raynelle Bring, [facility]  Radiation Oncologist - Dr. Tyler Pita, [facility]   Medical Oncologist - Dr. Zola Button, [place]  Recommendations: 1) Surgery  Or  2) Hormonal therapy (ADT) followed by  3) Radiation   * These recommendations are based on information available as of today's consult.      Recommendations may change depending on the results of further tests or exams.    Next Steps: 1) Consider your options.  Kathlee Nations, your nurse navigator, will follow up with you to ensure your treatment decision is finalized.  Please do not hesitate to reach with any additional questions, or concerns prior.    When appointments need to be scheduled, you will be contacted by [facility] and/or [facility].  Questions?  Please do not hesitate to call Katheren Puller, BSN, RN at [phone] with any questions or concerns.  Kathlee Nations is your Oncology Nurse Navigator and is available to assist you while you're receiving your medical care at [facility].

## 2021-12-09 NOTE — Progress Notes (Signed)
Reason for the request:    Prostate cancer  HPI: I was asked by Dr. Jeffie Pollock to evaluate Peter Stevens for the evaluation of prostate cancer.  He is a 61 year old man history of hyperlipidemia and erectile dysfunction was found to have an elevated PSA up to 5.1.  He has subsequently evaluated by Dr. Jeffie Pollock underwent an MRI completed on October 14, 2021.  The MRI showed a PI-RADS category 4 lesion in the right posterior lateral peripheral zone.  PI-RADS category 3 lesions of the left posterior lateral peripheral zone in the right anterior transitional zone were noted.  He subsequently underwent MRI fusion biopsy and the targeted region showed 3+4 equal 7 with 40% tumor detected.  There is a 4+3 equal 7 low volume disease also noted on the right.  The prostate biopsy did show predominantly 3+4 and 3+3 equal 6 disease.  PSMA PET scan obtained on November 10, 2021 did not show any evidence of metastatic disease but did show mild tracer uptake in the left external iliac node that is equivocal.  Clinically, he does report dysfunction and occasional nocturia, hematuria, dysuria or urinary frequency his performance status quality of life remain excellent.  He does not report any headaches, blurry vision, syncope or seizures. Does not report any fevers, chills or sweats.  Does not report any cough, wheezing or hemoptysis.  Does not report any chest pain, palpitation, orthopnea or leg edema.  Does not report any nausea, vomiting or abdominal pain.  Does not report any constipation or diarrhea.  Does not report any skeletal complaints.    Does not report frequency, urgency or hematuria.  Does not report any skin rashes or lesions. Does not report any heat or cold intolerance.  Does not report any lymphadenopathy or petechiae.  Does not report any anxiety or depression.  Remaining review of systems is negative.     Past Medical History:  Diagnosis Date   Herpes simplex    Hyperlipemia    Paresthesia   :  No past surgical  history on file.:   Current Outpatient Medications:    cholecalciferol (VITAMIN D3) 25 MCG (1000 UNIT) tablet, Take 1,000 Units by mouth daily., Disp: , Rfl:    famotidine (PEPCID) 20 MG tablet, Take 20 mg by mouth 2 (two) times daily., Disp: , Rfl:    sildenafil (VIAGRA) 50 MG tablet, Take 50 mg by mouth daily as needed for erectile dysfunction., Disp: , Rfl:    ezetimibe (ZETIA) 10 MG tablet, Take 10 mg by mouth daily. (Patient not taking: Reported on 12/09/2021), Disp: , Rfl:    gabapentin (NEURONTIN) 300 MG capsule, Take 300 mg by mouth 3 (three) times daily. (Patient not taking: Reported on 12/09/2021), Disp: , Rfl:    HYDROcodone-acetaminophen (NORCO/VICODIN) 5-325 MG tablet, Take 1 tablet by mouth every 6 (six) hours as needed for moderate pain. (Patient not taking: Reported on 12/09/2021), Disp: , Rfl:    vitamin B-12 (CYANOCOBALAMIN) 1000 MCG tablet, Take 1,000 mcg by mouth daily. (Patient not taking: Reported on 12/09/2021), Disp: , Rfl: :   Allergies  Allergen Reactions   Lipitor [Atorvastatin]    Penicillins   :   Family History  Problem Relation Age of Onset   Prostate cancer Father    Heart attack Father   :   Social History   Socioeconomic History   Marital status: Single    Spouse name: Not on file   Number of children: 0    Years of education: college  Highest education level: Not on file  Occupational History    Comment: Engineer  Tobacco Use   Smoking status: Never   Smokeless tobacco: Never  Substance and Sexual Activity   Alcohol use: Yes    Alcohol/week: 4.0 - 5.0 standard drinks of alcohol    Types: 4 - 5 drink(s) per week    Comment: weekly    Drug use: No   Sexual activity: Not on file  Other Topics Concern   Not on file  Social History Narrative   Engineer.   College education BSME   Patient lives with a friend Peter Stevens.    Patient works at [employer].    Patient does not have any children.    Social Determinants of Health    Financial Resource Strain: Not on file  Food Insecurity: Not on file  Transportation Needs: Not on file  Physical Activity: Not on file  Stress: Not on file  Social Connections: Not on file  Intimate Partner Violence: Not on file  :  Pertinent items are noted in HPI.  Exam:  General appearance: alert and cooperative appeared without distress. Head: atraumatic without any abnormalities. Eyes: conjunctivae/corneas clear. PERRL.  Sclera anicteric. Throat: lips, mucosa, and tongue normal; without oral thrush or ulcers. Resp: clear to auscultation bilaterally without rhonchi, wheezes or dullness to percussion. Cardio: regular rate and rhythm, S1, S2 normal, no murmur, click, rub or gallop GI: soft, non-tender; bowel sounds normal; no masses,  no organomegaly Skin: Skin color, texture, turgor normal. No rashes or lesions Lymph nodes: Cervical, supraclavicular, and axillary nodes normal. Neurologic: Grossly normal without any motor, sensory or deep tendon reflexes. Musculoskeletal: No joint deformity or effusion.   NM PET (PSMA) SKULL TO MID THIGH  Result Date: 11/12/2021 CLINICAL DATA:  New diagnosis of prostate cancer.  PSA of 5.2. EXAM: NUCLEAR MEDICINE PET SKULL BASE TO THIGH TECHNIQUE: 8.2 mCi F18 Piflufolastat (Pylarify) was injected intravenously. Full-ring PET imaging was performed from the skull base to thigh after the radiotracer. CT data was obtained and used for attenuation correction and anatomic localization. COMPARISON:  Prostate MRI 10/14/2021.  Abdominopelvic CT 08/01/2014. FINDINGS: NECK No radiotracer activity in neck lymph nodes. Incidental CT finding: No cervical adenopathy. CHEST No radiotracer accumulation within mediastinal or hilar lymph nodes. Incidental CT finding: Mild cardiomegaly. Aortic atherosclerosis. Mild right hemidiaphragm elevation. ABDOMEN/PELVIS Prostate: Multifocal prostatic tracer affinity, most significant within the posterior midgland, slightly  greater right than left at a S.U.V. max of 5.5. Lymph nodes: A left external iliac node measures 6 mm and a S.U.V. max of 2.8 on 203/4. Liver: No evidence of liver metastasis Incidental CT finding: Abdominal aortic atherosclerosis. Normal adrenal glands. Normal noncontrast appearance of the liver, spleen, stomach, pancreas, kidneys, urinary bladder. Tiny bilateral fat containing inguinal hernias. SKELETON No focal  activity to suggest skeletal metastasis. Left proximal femur fixation. Degenerative partial fusion of bilateral sacroiliac joints. IMPRESSION: 1. Heterogeneous prostatic tracer affinity, likely site or sites of primary. 2. Mildly tracer avid left external iliac node is equivocal but slightly favored to represent nodal metastasis. 3. No extrapelvic or osseous tracer avid metastasis identified. Electronically Signed   By: Abigail Miyamoto M.D.   On: 11/12/2021 10:25    Assessment and Plan:    61 year old with prostate cancer documented in June 2023.  He was found to have score 4+3 = 7, PSA 5.1 indicating unfavorable intermediate risk localized prostate cancer.  PSMA PET scan does show an equivocal finding in the left exterior  iliac lymph node.  This case was discussed today in detail the prostate cancer multidisciplinary clinic including review of his pathology as well as imaging studies with the reviewing radiologist.  Treatment options including primary surgical therapy versus radiation therapy and short-term ADT were reviewed.  Risks and benefits of these approaches were discussed today with the patient as well as the equal oncological outcomes were discussed.  We also discussed that the presence of this lymph node should not change the approach for definitive management but surgery might for a more definitive answer regarding the etiology.  The role for additional systemic therapy was discussed at this time.  These include androgen receptor pathway inhibitors as well as systemic chemotherapy among  others.  At this time these options have been deferred given his localized nature.  All his questions were answered to his satisfaction.  30  minutes were dedicated to this visit. The time was spent on reviewing laboratory data, imaging studies, discussing treatment options, and answering questions regarding future plan.    A copy of this consult has been forwarded to the requesting physician.

## 2021-12-10 ENCOUNTER — Encounter: Payer: Self-pay | Admitting: Genetic Counselor

## 2021-12-10 NOTE — Progress Notes (Addendum)
REFERRING PROVIDER: Zola Button, MD  PRIMARY PROVIDER:  Lavone Orn, MD  PRIMARY REASON FOR VISIT:  1. Malignant neoplasm of prostate ([place])   2. Family history of prostate cancer in father     HISTORY OF PRESENT ILLNESS:   Peter Stevens, a 61 y.o. male, was seen for a [place] cancer genetics consultation at the request of Dr. Alen Blew due to a personal and family history of cancer.  Peter Stevens presents to clinic today to discuss the possibility of a hereditary predisposition to cancer, to discuss genetic testing, and to further clarify his future cancer risks, as well as potential cancer risks for family members.   In 2023, at the age of 76, Peter Stevens was diagnosed with prostate cancer.  CANCER HISTORY:  Oncology History  Malignant neoplasm of prostate ([place])  10/20/2021 Cancer Staging   Staging form: Prostate, AJCC 8th Edition - Clinical stage from 10/20/2021: Stage IVA (cT1c, cN1, cM0, PSA: 5.2, Grade Group: 3) - Signed by Freeman Caldron, PA-C on 12/09/2021 Histopathologic type: Adenocarcinoma, NOS Prostate specific antigen (PSA) range: Less than 10 Gleason primary pattern: 4 Gleason secondary pattern: 3 Gleason score: 7 Histologic grading system: 5 grade system Number of biopsy cores examined: 19 Number of biopsy cores positive: 14 Location of positive needle core biopsies: Both sides   12/09/2021 Initial Diagnosis   Malignant neoplasm of prostate [facility])     Past Medical History:  Diagnosis Date   Herpes simplex    Hyperlipemia    Paresthesia     No past surgical history on file.  Social History   Socioeconomic History   Marital status: Single    Spouse name: Not on file   Number of children: 0    Years of education: college   Highest education level: Not on file  Occupational History    Comment: Engineer  Tobacco Use   Smoking status: Never   Smokeless tobacco: Never  Substance and Sexual Activity   Alcohol use: Yes    Alcohol/week: 4.0 - 5.0 standard  drinks of alcohol    Types: 4 - 5 drink(s) per week    Comment: weekly    Drug use: No   Sexual activity: Not on file  Other Topics Concern   Not on file  Social History Narrative   Engineer.   College education BSME   Patient lives with a friend Ola Spurr.    Patient works at [employer].    Patient does not have any children.    Social Determinants of Health   Financial Resource Strain: Not on file  Food Insecurity: Not on file  Transportation Needs: Not on file  Physical Activity: Not on file  Stress: Not on file  Social Connections: Not on file     FAMILY HISTORY:  We obtained a detailed, 4-generation family history.  Significant diagnoses are listed below: Family History  Problem Relation Age of Onset   Prostate cancer Father 13   Heart attack Father    Esophageal cancer Maternal Uncle    Cancer Paternal Uncle        unknown type   Breast cancer Maternal Grandmother 90     Peter Stevens's maternal uncle was diagnosed with esophageal cancer. His maternal grandmother was diagnosed with breast cancer at age 47, she is deceased. His father was diagnosed with prostate cancer at age 62. His paternal uncle was diagnosed with an unknown type of cancer, it had already metastasized when he was diagnosed. Peter Stevens is unaware  of previous family history of genetic testing for hereditary cancer risks. There is no reported Ashkenazi Jewish ancestry.  GENETIC COUNSELING ASSESSMENT: Peter Stevens is a 61 y.o. male with a personal and family history of cancer which is somewhat suggestive of a hereditary predisposition to cancer. We, therefore, discussed and recommended the following at today's visit.   DISCUSSION: We discussed that 5 - 10% of cancer is hereditary, with most cases of prostate associated with BRCA1/2.  There are other genes that can be associated with hereditary prostate cancer syndromes.  We discussed that testing is beneficial for several reasons including knowing  how to follow individuals after completing their treatment, identifying whether potential treatment options would be beneficial, and understanding if other family members could be at risk for cancer and allowing them to undergo genetic testing.   We reviewed the characteristics, features and inheritance patterns of hereditary cancer syndromes. We also discussed genetic testing, including the appropriate family members to test, the process of testing, insurance coverage and turn-around-time for results. We discussed the implications of a negative, positive, carrier and/or variant of uncertain significant result. We recommended Peter Stevens pursue genetic testing for a panel that includes genes associated with prostate and breast cancer.   Peter Stevens elected to have [place] Panel. The CancerNext gene panel offered by [employer] includes sequencing, rearrangement analysis, and RNA analysis for the following 36 genes:   APC, ATM, AXIN2, BARD1, BMPR1A, BRCA1, BRCA2, BRIP1, CDH1, CDK4, CDKN2A, CHEK2, DICER1, HOXB13, EPCAM, GREM1, MLH1, MSH2, MSH3, MSH6, MUTYH, NBN, NF1, NTHL1, PALB2, PMS2, POLD1, POLE, PTEN, RAD51C, RAD51D, RECQL, SMAD4, SMARCA4, STK11, and TP53.   Based on Peter Stevens's personal and family history of cancer, he does not meet medical criteria for genetic testing. He may have an out of pocket cost. We discussed that if his out of pocket cost for testing is over $100, the laboratory will call and confirm whether he wants to proceed with testing.  If the out of pocket cost of testing is less than $100 he will be billed by the genetic testing laboratory.   PLAN: After considering the risks, benefits, and limitations, Peter Stevens provided informed consent to pursue genetic testing and the blood sample was sent to [facility] for analysis of the CancerNext Panel. Results should be available within approximately 2-3 weeks' time, at which point they will be disclosed by telephone to  Peter Stevens, as will any additional recommendations warranted by these results. Peter Stevens will receive a summary of his genetic counseling visit and a copy of his results once available. This information will also be available in Epic.   Peter Stevens questions were answered to his satisfaction today. Our contact information was provided should additional questions or concerns arise. Thank you for the referral and allowing [place] to share in the care of your patient.   Lucille Passy, MS, [facility] Genetic Counselor [email or web address] (P) [phone]  The patient was seen for a total of 20 minutes in face-to-face genetic counseling. Drs. Lindi Adie and/or Burr Medico were available to discuss this case as needed.   _______________________________________________________________________ For Office Staff:  Number of people involved in session: 2 Was an Intern/ student involved with case: no

## 2021-12-18 DIAGNOSIS — L2089 Other atopic dermatitis: Secondary | ICD-10-CM | POA: Diagnosis not present

## 2021-12-18 DIAGNOSIS — D1801 Hemangioma of skin and subcutaneous tissue: Secondary | ICD-10-CM | POA: Diagnosis not present

## 2021-12-18 DIAGNOSIS — L821 Other seborrheic keratosis: Secondary | ICD-10-CM | POA: Diagnosis not present

## 2021-12-22 DIAGNOSIS — N481 Balanitis: Secondary | ICD-10-CM | POA: Diagnosis not present

## 2021-12-22 DIAGNOSIS — C775 Secondary and unspecified malignant neoplasm of intrapelvic lymph nodes: Secondary | ICD-10-CM | POA: Diagnosis not present

## 2021-12-22 DIAGNOSIS — C61 Malignant neoplasm of prostate: Secondary | ICD-10-CM | POA: Diagnosis not present

## 2021-12-23 ENCOUNTER — Other Ambulatory Visit: Payer: Self-pay | Admitting: Urology

## 2021-12-24 ENCOUNTER — Other Ambulatory Visit: Payer: Self-pay | Admitting: Urology

## 2021-12-24 ENCOUNTER — Telehealth: Payer: Self-pay | Admitting: *Deleted

## 2021-12-24 MED ORDER — SULFAMETHOXAZOLE-TRIMETHOPRIM 800-160 MG PO TABS: 1.0000 | ORAL_TABLET | Freq: Two times a day (BID) | ORAL | Status: AC

## 2021-12-24 NOTE — Telephone Encounter (Signed)
CALLED PATIENT TO INFORM OF FID. MARKER AND SPACE OAR PLACEMENT ON 02-10-22 @ 9:30 AM AND HIS SIM APPT. ON 02-19-22- ARRIVAL TIME- 9:45 AM @ [facility], INFORMED PATIENT TO ARRIVE WITH A FULL BLADDER, SPOKE WITH PATIENT AND HE IS AWARE OF THESE APPTS. AND THE INSTRUCTIONS

## 2022-01-05 ENCOUNTER — Telehealth: Payer: Self-pay | Admitting: Genetic Counselor

## 2022-01-05 ENCOUNTER — Encounter: Payer: Self-pay | Admitting: Genetic Counselor

## 2022-01-05 DIAGNOSIS — Z1379 Encounter for other screening for genetic and chromosomal anomalies: Secondary | ICD-10-CM | POA: Insufficient documentation

## 2022-01-05 NOTE — Telephone Encounter (Signed)
I contacted [name] to discuss his genetic testing results. No pathogenic variants were identified in the 36 genes analyzed. Of note, a variant of uncertain significance was identified in the BRCA2 gene. Detailed clinic note to follow.  The test report has been scanned into EPIC and is located under the Molecular Pathology section of the Results Review tab.  A portion of the result report is included below for reference.   Lucille Passy, MS, [facility] Genetic Counselor [email or web address] (P) [phone]

## 2022-01-06 ENCOUNTER — Encounter: Payer: Self-pay | Admitting: Genetic Counselor

## 2022-01-07 ENCOUNTER — Ambulatory Visit: Payer: Self-pay | Admitting: Genetic Counselor

## 2022-01-07 DIAGNOSIS — Z1379 Encounter for other screening for genetic and chromosomal anomalies: Secondary | ICD-10-CM

## 2022-01-07 NOTE — Progress Notes (Signed)
HPI:   Mr. Oren was previously seen in the [facility] due to a personal and family history of cancer and concerns regarding a hereditary predisposition to cancer. Please refer to our prior cancer genetics clinic note for more information regarding our discussion, assessment and recommendations, at the time. Mr. Coppedge recent genetic test results were disclosed to him, as were recommendations warranted by these results. These results and recommendations are discussed in more detail below.  CANCER HISTORY:  Oncology History  Malignant neoplasm of prostate ([place])  10/20/2021 Cancer Staging   Staging form: Prostate, AJCC 8th Edition - Clinical stage from 10/20/2021: Stage IVA (cT1c, cN1, cM0, PSA: 5.2, Grade Group: 3) - Signed by Freeman Caldron, PA-C on 12/09/2021 Histopathologic type: Adenocarcinoma, NOS Prostate specific antigen (PSA) range: Less than 10 Gleason primary pattern: 4 Gleason secondary pattern: 3 Gleason score: 7 Histologic grading system: 5 grade system Number of biopsy cores examined: 19 Number of biopsy cores positive: 14 Location of positive needle core biopsies: Both sides   12/09/2021 Initial Diagnosis   Malignant neoplasm of prostate ([place])     FAMILY HISTORY:  We obtained a detailed, 4-generation family history.  Significant diagnoses are listed below:      Family History  Problem Relation Age of Onset   Prostate cancer Father 6   Heart attack Father     Esophageal cancer Maternal Uncle     Cancer Paternal Uncle          unknown type   Breast cancer Maternal Grandmother 35       Mr. Basil's maternal uncle was diagnosed with esophageal cancer. His maternal grandmother was diagnosed with breast cancer at age 82, she is deceased. His father was diagnosed with prostate cancer at age 21. His paternal uncle was diagnosed with an unknown type of cancer, it had already metastasized when he was diagnosed. Mr. Sharps is unaware of previous  family history of genetic testing for hereditary cancer risks. There is no reported Ashkenazi Jewish ancestry.  GENETIC TEST RESULTS:  The [facility] CancerNext Panel found no pathogenic mutations.   The CancerNext gene panel offered by [employer] includes sequencing, rearrangement analysis, and RNA analysis for the following 36 genes:   APC, ATM, AXIN2, BARD1, BMPR1A, BRCA1, BRCA2, BRIP1, CDH1, CDK4, CDKN2A, CHEK2, DICER1, HOXB13, EPCAM, GREM1, MLH1, MSH2, MSH3, MSH6, MUTYH, NBN, NF1, NTHL1, PALB2, PMS2, POLD1, POLE, PTEN, RAD51C, RAD51D, RECQL, SMAD4, SMARCA4, STK11, and TP53.    The test report has been scanned into EPIC and is located under the Molecular Pathology section of the Results Review tab.  A portion of the result report is included below for reference. Genetic testing reported out on 01/01/2022.       Genetic testing identified a variant of uncertain significance (VUS) in the BRCA2 gene called [id number].  At this time, it is unknown if this variant is associated with an increased risk for cancer or if it is benign, but most uncertain variants are reclassified to benign. It should not be used to make medical management decisions. With time, we suspect the laboratory will determine the significance of this variant, if any. If the laboratory reclassifies this variant, we will attempt to contact Mr. Fawcett to discuss it further.   Even though a pathogenic variant was not identified, possible explanations for the cancer in the family may include: There may be no hereditary risk for cancer in the family. The cancers in Mr. Fortner and/or his family may be due to other  genetic or environmental factors. There may be a gene mutation in one of these genes that current testing methods cannot detect, but that chance is small. There could be another gene that has not yet been discovered, or that we have not yet tested, that is responsible for the cancer diagnoses in the family.  The variant of  uncertain significance detected in the BRCA2 gene may be reclassified as a pathogenic variant in the future. At this time, we do not know if this variant increases the risk for cancer.  Therefore, it is important to remain in touch with cancer genetics in the future so that we can continue to offer Mr. Seider the most up to date genetic testing.   ADDITIONAL GENETIC TESTING:  We discussed with Mr. Kem that his genetic testing was fairly extensive.  If there are genes identified to increase cancer risk that can be analyzed in the future, we would be happy to discuss and coordinate this testing at that time.     CANCER SCREENING RECOMMENDATIONS:  Mr. Anaya test result is considered negative (normal).  This means that we have not identified a hereditary cause for his personal and family history of cancer at this time.   An individual's cancer risk and medical management are not determined by genetic test results alone. Overall cancer risk assessment incorporates additional factors, including personal medical history, family history, and any available genetic information that may result in a personalized plan for cancer prevention and surveillance. Therefore, it is recommended he continue to follow the cancer management and screening guidelines provided by his oncology and primary healthcare provider.  RECOMMENDATIONS FOR FAMILY MEMBERS:   We do not recommend familial testing for the BRCA2 variant of uncertain significance (VUS).  FOLLOW-UP:  Cancer genetics is a rapidly advancing field and it is possible that new genetic tests will be appropriate for him and/or his family members in the future. We encouraged him to remain in contact with cancer genetics on an annual basis so we can update his personal and family histories and let him know of advances in cancer genetics that may benefit this family.   Our contact number was provided. Mr. Martine questions were answered to his satisfaction,  and he knows he is welcome to call us at anytime with additional questions or concerns.   Lucille Passy, MS, [facility] Genetic Counselor [email or web address] (P) [phone]

## 2022-01-13 NOTE — Progress Notes (Signed)
RN placed call/voicemail to get patient scheduled for additional ADT injection for September @ [facility].   RN spoke with patient, he is aware this is being worked on.  RN educated patient on next steps for fiducial's, spaceOAR, and CT Simulation.  All questions answered, no further needs at this time.  Will continue to follow.

## 2022-01-16 DIAGNOSIS — I10 Essential (primary) hypertension: Secondary | ICD-10-CM | POA: Diagnosis not present

## 2022-01-16 DIAGNOSIS — Z0000 Encounter for general adult medical examination without abnormal findings: Secondary | ICD-10-CM | POA: Diagnosis not present

## 2022-01-16 DIAGNOSIS — E7800 Pure hypercholesterolemia, unspecified: Secondary | ICD-10-CM | POA: Diagnosis not present

## 2022-01-16 DIAGNOSIS — K219 Gastro-esophageal reflux disease without esophagitis: Secondary | ICD-10-CM | POA: Diagnosis not present

## 2022-01-19 NOTE — Progress Notes (Signed)
RN left message to follow up regarding previous concern for additional ADT appointment.   Pt is scheduled for 9/14 @ [facility].  Voicemail left with call back information if needed.

## 2022-01-22 DIAGNOSIS — C61 Malignant neoplasm of prostate: Secondary | ICD-10-CM | POA: Diagnosis not present

## 2022-01-22 DIAGNOSIS — Z5111 Encounter for antineoplastic chemotherapy: Secondary | ICD-10-CM | POA: Diagnosis not present

## 2022-01-23 ENCOUNTER — Encounter (HOSPITAL_BASED_OUTPATIENT_CLINIC_OR_DEPARTMENT_OTHER): Payer: Self-pay | Admitting: Urology

## 2022-01-26 ENCOUNTER — Encounter (HOSPITAL_BASED_OUTPATIENT_CLINIC_OR_DEPARTMENT_OTHER): Payer: Self-pay | Admitting: Urology

## 2022-01-26 ENCOUNTER — Other Ambulatory Visit: Payer: Self-pay

## 2022-01-26 NOTE — Progress Notes (Signed)
Spoke w/ via phone for pre-op interview---pt Lab needs dos----  none             Lab results------ COVID test -----patient states asymptomatic no test needed Arrive at -------630 am 02-10-2022 NPO after MN NO Solid Food.  Clear liquids from MN until---530 am Med rec completed Medications to take morning of surgery -----pepcid Diabetic medication -----n/a Patient instructed no nail polish to be worn day of surgery Patient instructed to bring photo id and insurance card day of surgery Patient aware to have Driver (ride ) / caregiver   Maydelin Deming stegall friend  for 24 hours after surgery  Patient Special Instructions -----fleets enema hs night before surgery Pre-Op special Istructions -----n/a Patient verbalized understanding of instructions that were given at this phone interview. Patient denies shortness of breath, chest pain, fever, cough at this phone interview.

## 2022-01-28 DIAGNOSIS — C61 Malignant neoplasm of prostate: Secondary | ICD-10-CM | POA: Diagnosis not present

## 2022-01-28 DIAGNOSIS — Z191 Hormone sensitive malignancy status: Secondary | ICD-10-CM | POA: Diagnosis not present

## 2022-02-06 ENCOUNTER — Telehealth: Payer: Self-pay | Admitting: *Deleted

## 2022-02-06 NOTE — Telephone Encounter (Signed)
CALLED PATIENT TO INFORM OF FID. MARKER AND SPACE OAR PLACEMENT ON 02-10-22 AND HIS SIM ON 02-12-22- ARRIVAL TIME- 9:45 AM @ [facility], INFORMED PATIENT TO ARRIVE WITH A FULL BLADDER, SPOKE WITH PATIENT AND HE IS AWARE OF THIS APPT. AND THE INSTRUCTIONS

## 2022-02-09 NOTE — Progress Notes (Signed)
RN returned call, left message for call back to review upcoming appointments, and questions.

## 2022-02-10 ENCOUNTER — Encounter (HOSPITAL_BASED_OUTPATIENT_CLINIC_OR_DEPARTMENT_OTHER)
Admission: RE | Disposition: A | Discharge status: Home / Self Care | Payer: Self-pay | Source: Home / Self Care | Attending: Urology

## 2022-02-10 ENCOUNTER — Ambulatory Visit (HOSPITAL_BASED_OUTPATIENT_CLINIC_OR_DEPARTMENT_OTHER)
Admission: RE | Admit: 2022-02-10 | Discharge: 2022-02-10 | Disposition: A | Discharge status: Home / Self Care | Payer: BC Managed Care – PPO | Attending: Urology | Admitting: Urology

## 2022-02-10 ENCOUNTER — Ambulatory Visit (HOSPITAL_BASED_OUTPATIENT_CLINIC_OR_DEPARTMENT_OTHER): Payer: BC Managed Care – PPO | Admitting: Anesthesiology

## 2022-02-10 ENCOUNTER — Encounter (HOSPITAL_BASED_OUTPATIENT_CLINIC_OR_DEPARTMENT_OTHER): Payer: Self-pay | Admitting: Urology

## 2022-02-10 ENCOUNTER — Other Ambulatory Visit: Payer: Self-pay

## 2022-02-10 DIAGNOSIS — C61 Malignant neoplasm of prostate: Secondary | ICD-10-CM | POA: Insufficient documentation

## 2022-02-10 DIAGNOSIS — Z01818 Encounter for other preprocedural examination: Secondary | ICD-10-CM

## 2022-02-10 DIAGNOSIS — K219 Gastro-esophageal reflux disease without esophagitis: Secondary | ICD-10-CM | POA: Diagnosis not present

## 2022-02-10 DIAGNOSIS — C775 Secondary and unspecified malignant neoplasm of intrapelvic lymph nodes: Secondary | ICD-10-CM | POA: Diagnosis not present

## 2022-02-10 HISTORY — DX: Gastro-esophageal reflux disease without esophagitis: K21.9

## 2022-02-10 HISTORY — PX: SPACE OAR INSTILLATION: SHX6769

## 2022-02-10 HISTORY — DX: Dermatitis, unspecified: L30.9

## 2022-02-10 HISTORY — DX: Malignant neoplasm of prostate: C61

## 2022-02-10 HISTORY — PX: GOLD SEED IMPLANT: SHX6343

## 2022-02-10 HISTORY — DX: Presence of spectacles and contact lenses: Z97.3

## 2022-02-10 SURGERY — INSERTION, GOLD SEEDS
Anesthesia: Monitor Anesthesia Care | Site: Prostate

## 2022-02-10 MED ORDER — MIDAZOLAM HCL 5 MG/5ML IJ SOLN
INTRAMUSCULAR | Status: DC | PRN
  Administered 2022-02-10: 2 mg via INTRAVENOUS

## 2022-02-10 MED ORDER — ONDANSETRON HCL 4 MG/2ML IJ SOLN
INTRAMUSCULAR | Status: DC | PRN
  Administered 2022-02-10: 4 mg via INTRAVENOUS

## 2022-02-10 MED ORDER — OXYCODONE HCL 5 MG PO TABS
ORAL_TABLET | ORAL | Status: AC
  Filled 2022-02-10: qty 1

## 2022-02-10 MED ORDER — PROPOFOL 500 MG/50ML IV EMUL
INTRAVENOUS | Status: DC | PRN
  Administered 2022-02-10: 200 ug/kg/min via INTRAVENOUS

## 2022-02-10 MED ORDER — OXYCODONE HCL 5 MG/5ML PO SOLN: 5.0000 mg | Freq: Once | ORAL | Status: AC | PRN

## 2022-02-10 MED ORDER — ACETAMINOPHEN 500 MG PO TABS
1000.0000 mg | ORAL_TABLET | Freq: Once | ORAL | Status: AC
  Administered 2022-02-10: 1000 mg via ORAL

## 2022-02-10 MED ORDER — CIPROFLOXACIN IN D5W 400 MG/200ML IV SOLN
400.0000 mg | Freq: Once | INTRAVENOUS | Status: AC
  Administered 2022-02-10: 400 mg via INTRAVENOUS

## 2022-02-10 MED ORDER — PROPOFOL 1000 MG/100ML IV EMUL
INTRAVENOUS | Status: AC
  Filled 2022-02-10: qty 100

## 2022-02-10 MED ORDER — MEPERIDINE HCL 25 MG/ML IJ SOLN: 6.2500 mg | INTRAMUSCULAR | Status: DC | PRN

## 2022-02-10 MED ORDER — FENTANYL CITRATE (PF) 100 MCG/2ML IJ SOLN
INTRAMUSCULAR | Status: AC
  Filled 2022-02-10: qty 2

## 2022-02-10 MED ORDER — SODIUM CHLORIDE (PF) 0.9 % IJ SOLN
INTRAMUSCULAR | Status: DC | PRN
  Administered 2022-02-10: 10 mL

## 2022-02-10 MED ORDER — ONDANSETRON HCL 4 MG/2ML IJ SOLN
INTRAMUSCULAR | Status: AC
  Filled 2022-02-10: qty 2

## 2022-02-10 MED ORDER — MIDAZOLAM HCL 2 MG/2ML IJ SOLN
INTRAMUSCULAR | Status: AC
  Filled 2022-02-10: qty 2

## 2022-02-10 MED ORDER — CIPROFLOXACIN IN D5W 400 MG/200ML IV SOLN
INTRAVENOUS | Status: AC
  Filled 2022-02-10: qty 200

## 2022-02-10 MED ORDER — FENTANYL CITRATE (PF) 100 MCG/2ML IJ SOLN
INTRAMUSCULAR | Status: DC | PRN
  Administered 2022-02-10: 50 ug via INTRAVENOUS

## 2022-02-10 MED ORDER — BUPIVACAINE-EPINEPHRINE (PF) 0.25% -1:200000 IJ SOLN
INTRAMUSCULAR | Status: DC | PRN
  Administered 2022-02-10: 10 mL

## 2022-02-10 MED ORDER — FLEET ENEMA 7-19 GM/118ML RE ENEM: 1.0000 | ENEMA | Freq: Once | RECTAL | Status: DC

## 2022-02-10 MED ORDER — LACTATED RINGERS IV SOLN: INTRAVENOUS | Status: DC

## 2022-02-10 MED ORDER — FENTANYL CITRATE (PF) 100 MCG/2ML IJ SOLN: 25.0000 ug | INTRAMUSCULAR | Status: DC | PRN

## 2022-02-10 MED ORDER — ACETAMINOPHEN 500 MG PO TABS
ORAL_TABLET | ORAL | Status: AC
  Filled 2022-02-10: qty 2

## 2022-02-10 MED ORDER — MIDAZOLAM HCL 2 MG/2ML IJ SOLN: 0.5000 mg | Freq: Once | INTRAMUSCULAR | Status: DC | PRN

## 2022-02-10 MED ORDER — PROPOFOL 10 MG/ML IV BOLUS
INTRAVENOUS | Status: DC | PRN
  Administered 2022-02-10: 20 mg via INTRAVENOUS

## 2022-02-10 MED ORDER — PROMETHAZINE HCL 25 MG/ML IJ SOLN: 6.2500 mg | INTRAMUSCULAR | Status: DC | PRN

## 2022-02-10 MED ORDER — OXYCODONE HCL 5 MG PO TABS
5.0000 mg | ORAL_TABLET | Freq: Once | ORAL | Status: AC | PRN
  Administered 2022-02-10: 5 mg via ORAL

## 2022-02-10 SURGICAL SUPPLY — 26 items
BLADE CLIPPER SENSICLIP SURGIC (BLADE) ×2 IMPLANT
CNTNR URN SCR LID CUP LEK RST (MISCELLANEOUS) ×2 IMPLANT
CONT SPEC 4OZ STRL OR WHT (MISCELLANEOUS) ×2
COVER BACK TABLE 60X90IN (DRAPES) ×2 IMPLANT
DRSG TEGADERM 4X4.75 (GAUZE/BANDAGES/DRESSINGS) ×2 IMPLANT
DRSG TEGADERM 8X12 (GAUZE/BANDAGES/DRESSINGS) ×2 IMPLANT
GAUZE SPONGE 4X4 12PLY STRL (GAUZE/BANDAGES/DRESSINGS) ×2 IMPLANT
GAUZE SPONGE 4X4 12PLY STRL LF (GAUZE/BANDAGES/DRESSINGS) IMPLANT
GLOVE BIO SURGEON STRL SZ7.5 (GLOVE) ×2 IMPLANT
GLOVE ECLIPSE 8.0 STRL XLNG CF (GLOVE) ×2 IMPLANT
GLOVE SURG ORTHO 8.5 STRL (GLOVE) ×2 IMPLANT
IMPL SPACEOAR VUE SYSTEM (Spacer) ×2 IMPLANT
IMPLANT SPACEOAR VUE SYSTEM (Spacer) ×2 IMPLANT
KIT TURNOVER CYSTO (KITS) ×2 IMPLANT
MARKER GOLD PRELOAD 1.2X3 (Urological Implant) ×2 IMPLANT
MARKER SKIN DUAL TIP RULER LAB (MISCELLANEOUS) ×2 IMPLANT
NDL SPNL 22GX3.5 QUINCKE BK (NEEDLE) ×2 IMPLANT
NEEDLE SPNL 22GX3.5 QUINCKE BK (NEEDLE) ×2 IMPLANT
SEED GOLD PRELOAD 1.2X3 (Urological Implant) ×2 IMPLANT
SHEATH ULTRASOUND LF (SHEATH) IMPLANT
SHEATH ULTRASOUND LTX NONSTRL (SHEATH) IMPLANT
SURGILUBE 2OZ TUBE FLIPTOP (MISCELLANEOUS) ×2 IMPLANT
SYR 10ML LL (SYRINGE) ×2 IMPLANT
SYR CONTROL 10ML LL (SYRINGE) ×2 IMPLANT
TOWEL OR 17X26 10 PK STRL BLUE (TOWEL DISPOSABLE) ×2 IMPLANT
UNDERPAD 30X36 HEAVY ABSORB (UNDERPADS AND DIAPERS) ×2 IMPLANT

## 2022-02-10 NOTE — Anesthesia Postprocedure Evaluation (Signed)
Anesthesia Post Note  Patient: Peter Stevens  Procedure(s) Performed: GOLD SEED IMPLANT (Prostate) SPACE OAR INSTILLATION (Perineum)     Patient location during evaluation: Phase II Anesthesia Type: MAC Level of consciousness: awake and alert, patient cooperative and oriented Pain management: pain level controlled Vital Signs Assessment: post-procedure vital signs reviewed and stable Respiratory status: nonlabored ventilation, respiratory function stable and spontaneous breathing Cardiovascular status: blood pressure returned to baseline and stable Postop Assessment: no apparent nausea or vomiting, able to ambulate and adequate PO intake Anesthetic complications: no   No notable events documented.  Last Vitals:  Vitals:   02/10/22 0915 02/10/22 0930  BP: 121/81 129/81  Pulse: (!) 50 (!) 55  Resp: 17 20  Temp:    SpO2: 96% 100%    Last Pain:  Vitals:   02/10/22 0930  TempSrc:   PainSc: 4                  Braden Deloach,E. Berenise Hunton

## 2022-02-10 NOTE — Anesthesia Preprocedure Evaluation (Signed)
Anesthesia Evaluation  Patient identified by MRN, date of birth, ID band Patient awake    Reviewed: Allergy & Precautions, NPO status , Patient's Chart, lab work & pertinent test results  History of Anesthesia Complications Negative for: history of anesthetic complications  Airway Mallampati: II  TM Distance: >3 FB Neck ROM: Full    Dental  (+) Dental Advisory Given   Pulmonary neg pulmonary ROS,    breath sounds clear to auscultation       Cardiovascular (-) anginanegative cardio ROS   Rhythm:Regular Rate:Normal     Neuro/Psych    GI/Hepatic Neg liver ROS, GERD  Medicated and Controlled,  Endo/Other  negative endocrine ROS  Renal/GU negative Renal ROS   Prostate cancer    Musculoskeletal   Abdominal   Peds  Hematology negative hematology ROS (+)   Anesthesia Other Findings   Reproductive/Obstetrics                             Anesthesia Physical Anesthesia Plan  ASA: 2  Anesthesia Plan: MAC   Post-op Pain Management: Tylenol PO (pre-op)*   Induction:   PONV Risk Score and Plan: 1 and Ondansetron and Treatment may vary due to age or medical condition  Airway Management Planned: Natural Airway and Simple Face Mask  Additional Equipment: None  Intra-op Plan:   Post-operative Plan:   Informed Consent: I have reviewed the patients History and Physical, chart, labs and discussed the procedure including the risks, benefits and alternatives for the proposed anesthesia with the patient or authorized representative who has indicated his/her understanding and acceptance.     Dental advisory given  Plan Discussed with: CRNA and Surgeon  Anesthesia Plan Comments:         Anesthesia Quick Evaluation

## 2022-02-10 NOTE — Op Note (Signed)
Operative Note  Preoperative diagnosis:  1.  Regional risk adenocarcinoma of the prostate   Postoperative diagnosis: 1.  Regional risk adenocarcinoma of the prostate   Procedure(s): 1. Placement of fiducial markers into prostate 2. Insertion of SpaceOAR hydrogel   Surgeon: Rexene Alberts, MD  Assistants:  None  Anesthesia:  General  Complications:  None  EBL:  Minimal  Specimens: 1. None  Drains/Catheters: 1.  None  Indication:  Peter Stevens is a 60 y.o. male with clinically localized prostate cancer. After discussing management options for treatment, he elected to proceed with radiotherapy. He presents today for the above procedures. The potential risks, complications, alternative options, and expected recovery course have been discussed in detail with the patient and he has provided informed consent to proceed.  Description of procedure: The patient was administered preoperative antibiotics, placed in the dorsal lithotomy position, and prepped and draped in the usual sterile fashion. Next, transrectal ultrasonography was utilized to visualize the prostate. Three gold fiducial markers were then placed into the prostate via transperineal needles under ultrasound guidance at the right apex, right base, and left mid gland under direct ultrasound guidance. A site in the midline was then selected on the perineum for placement of an 18 g needle with saline. The needle was advanced above the rectum and below Denonvillier's fascia to the mid gland and confirmed to be in the midline on transverse imaging. One cc of saline was injected confirming appropriate expansion of this space. A total of 5 cc of saline was then injected to open the space further bilaterally. The saline syringe was then removed and the SpaceOAR hydrogel was injected with good distribution bilaterally. He tolerated the procedure well and without complications. He was given a voiding trial prior to discharge from the  PACU.  Matt R[facility]  Pager: [phone]

## 2022-02-10 NOTE — H&P (Signed)
Office Visit Report     12/22/2021   --------------------------------------------------------------------------------   Peter Stevens  MRN: [id number]  DOB: 03/15/1961, 61 year old Male  SSN:    PRIMARY CARE:    REFERRING:  Delanna Ahmadi, MD  PROVIDER:  Irine Seal, M.D.  LOCATION:  [facility] [phone]     --------------------------------------------------------------------------------   CC/HPI: CC: Prostate Cancer   12/22/21: Peter Stevens returns today in f/u to discuss initiation of ADT as part of his treatment plan for his GG3 T1c N1 Mx prostate cancer with a possible left ext iliac node on the PET. He has a rash on his penis that has been present for a few months and is concerned it may be a herpetic outbreak. He has something similar in the summers in the past.   Peter Stevens Note.   Physician requesting consult: Dr. Irine Seal  PCP: Dr. Lavone Orn  Location of consult: [facility] - Prostate Cancer [facility]   Peter Stevens is a 61 year old healthy gentleman who was found to have a persistently elevated PSA of 5.15 prompting an MRI of the prostate on 10/14/21. This indicated a 1.2 cm PI-RADS 4 lesion of the right posterolateral peripheral zone, a 7 mm PI-RADS 3 lesion of the left posterolateral peripheral zone at the base, and a 1.5 cm PI-RADS 3 left anterior mid/apical lesion. An MR/US fusion biopsy was performed on 10/20/21 and this confirmed Gleason 4+3=7 adenocarcinoma with 9 out of 12 systematic cores positive for malignancy and 5 out of 7 targeted biopsies positive for a total of 14 out of 19 cores positive. A PSMA PET scan was performed on 11/12/21 for stating and demonstrated an isolated area of uptake in a left external iliac lymph node.   Family history: Father was diagnosed in his 66s and treated with XRT.   Imaging studies: PSMA PET scan (11/12/21) - low level (SUV 2.8) solitary left external iliac lymph node uptake   PMH: He has a  history of GERD.  PSH: No abdominal surgeries.   TNM stage: cT1c N1 Mx  PSA: 5.15  Gleason score: 4+3=7 (GG 3)  Biopsy (10/20/21): 14/19 cores positive  Left: L lateral apex (5%, 3+3=6), L lateral base (20%, 3+4=7), L base (30%, 4+3=7)  Right: R apex (40%, 3+4=7), R lateral apex (5%, 3+3=6), R mid (5%, 3+3=6), R lateral mid (30%, 3+3=6), R base (5%, 3+3=6), R lateral base (20%, 3+3=6)  ROI - 1: 3/3 cores (40%, 40%, 40%, 3+4=7)  ROI - 2: 2/2 cores (5%, 4+3=7 and 5%, 3+4=7)  ROI - 3: Benign  Prostate volume: 22.2 cc   Urinary function: IPSS is 5.  Erectile function: SHIM score is 10. He has recently been using sildenafil up to 75 mg with mild to modest improvements but still not regular and reliable erections.     ALLERGIES: Lipitor Penicillin    MEDICATIONS: Pepcid     GU PSH: Prostate Needle Biopsy - 10/20/2021       PSH Notes: Broken femur. rad inserted- 2020   NON-GU PSH: Surgical Pathology, Gross And Microscopic Examination For Prostate Needle - 10/20/2021     GU PMH: Prostate Cancer - 12/09/2021 Abnormal radiologic findings on diagnostic imaging of other urinary organs - 10/20/2021 Elevated PSA - 10/20/2021, He has an elevated PSA that occurred after a bout of COVID. He had only slight improvement on a f/u level in January. His exam is benign. I will repeat the PSA today and if still  up, he will have an MRI of the prostate and then return for an MR fusion biopsy if positive and a standard biopsy if negative. I reviewed the procedure and risks including bleeding, infection and voiding difficulty. , - 09/17/2021 BPH w/LUTS - 09/17/2021 ED due to arterial insufficiency, He is only having a partial response to the 57m sildenafil. I let him know he could use up to 1038mif needed. - 09/17/2021 Weak Urinary Stream - 09/17/2021    NON-GU PMH: GERD Herpesviral infection, unspecified    FAMILY HISTORY: Dementia - Mother Heart Attack - Father High Blood Pressure - Father Prostate  Cancer - Father   SOCIAL HISTORY: Marital Status: Single Preferred Language: English; Ethnicity: Not Hispanic Or Latino; Race: White Current Smoking Status: Patient has never smoked.   Tobacco Use Assessment Completed: Used Tobacco in last 30 days? Does not use smokeless tobacco. Drinks 1 drink per week.  Does not use drugs. Drinks 2 caffeinated drinks per day. Patient's occupation isContractor [employer]  REVIEW OF SYSTEMS:    GU Review Male:   Patient reports get up at night to urinate, stream starts and stops, and trouble starting your stream. Patient denies frequent urination, hard to postpone urination, burning/ pain with urination, leakage of urine, have to strain to urinate , erection problems, and penile pain.  Gastrointestinal (Upper):   Patient denies nausea, vomiting, and indigestion/ heartburn.  Gastrointestinal (Lower):   Patient denies diarrhea and constipation.  Constitutional:   Patient denies fever, night sweats, weight loss, and fatigue.  Skin:   Patient denies skin rash/ lesion and itching.  Eyes:   Patient denies blurred vision and double vision.  Ears/ Nose/ Throat:   Patient denies sore throat and sinus problems.  Hematologic/Lymphatic:   Patient denies swollen glands and easy bruising.  Cardiovascular:   Patient denies leg swelling and chest pains.  Respiratory:   Patient denies cough and shortness of breath.  Endocrine:   Patient denies excessive thirst.  Musculoskeletal:   Patient denies back pain and joint pain.  Neurological:   Patient denies dizziness and headaches.  Psychologic:   Patient denies depression and anxiety.   Notes: weak stream    VITAL SIGNS: None   GU PHYSICAL EXAMINATION:    Penis: Circumcised, He has some a mild rash on the head of the penis.    MULTI-SYSTEM PHYSICAL EXAMINATION:    Constitutional: Well-nourished. No physical deformities. Normally developed. Good grooming.      Complexity of Data:  Records Review:   AUA  Symptom Score, Previous Doctor Records, Previous Patient Records  Urine Test Review:   Urinalysis  X-Ray Review: PET Scan: Reviewed Films. Reviewed Report. Discussed With Patient.     09/17/21 05/19/21 01/15/21  PSA  Total PSA 5.15 ng/mL 5.93 ng/ml 6.54 ng/ml   Notes:                     I have reviewed records from Dr. MaTammi Klippelnd Dr. BoAlinda Money    PROCEDURES:          Urinalysis w/Scope Dipstick Dipstick Cont'd Micro  Color: Yellow Bilirubin: Neg mg/dL WBC/hpf: NS (Not Seen)  Appearance: Clear Ketones: Neg mg/dL RBC/hpf: NS (Not Seen)  Specific Gravity: 1.025 Blood: Trace ery/uL Bacteria: Mod (26-50/hpf)  pH: <=5.0 Protein: Neg mg/dL Cystals: Ca Oxalate  Glucose: Neg mg/dL Urobilinogen: 0.2 mg/dL Casts: NS (Not Seen)    Nitrites: Neg Trichomonas: Not Present    Leukocyte Esterase: Neg leu/uL Mucous: Present  Epithelial Cells: NS (Not Seen)      Yeast: NS (Not Seen)      Sperm: Present    Notes: Occassional non-motile          Firmagon 275m - [id number]: 240 Adm. By: LJairo BenDezantil  Unit: mg Lot No [id number] Route: SQ Exp. Date 09/09/2023  Freq: None Mfgr.:   Site: RLQ and LLQ   ASSESSMENT:      ICD-10 Details  1 GU:   Prostate Cancer - C61 Chronic, Life Threatening - He has High risk prostate cancer with a nodal met and has elected XRT with ADT. I discussed the side effects of ADT in detail today and encouraged him to begin a calcium supplement. He was given [place] today and will return in a month with labs for his next injection with either firmagon or Eligard depending on his cardiac scoring CT.   2   Metastatic pelvic lymphadenopathy - C77.5 Chronic, Life Threatening  3   Balanitis - N48.1 Minor - He has a rash on the dorsal glans that doesn't look consistent with a herpes virus lesion. I recommended he try an OTC antifungal and consider seeing his dermatologist. I didn't think an antiviral was clearly indicated.    PLAN:             Medications Stop Meds: Levofloxacin 750 mg tablet 1 po 1 hour prior to the procedure  Start: 10/15/2021  Discontinue: 12/22/2021  - Reason: The medication cycle was completed.            Schedule Labs: 1 Month - Total Testosterone    1 Month - Lipid Panel    1 Month - CMP    1 Month - PSA    6 Months - PSA    6 Months - Lipid Panel    6 Months - CMP  Lab Notes: fasting  Return Visit/Planned Activity: 1 Month - Office Visit, Extender             Note: with labs for [place]: [facility] - [phone] Firmagon 24070mChemo Hormon Antineopl Sq/im) - 9- [id number]       Document Letter(s):  Created for Patient: Clinical Summary         Notes:   CC: Dr. JohLavone Orn  Urology Preoperative H&P   Chief Complaint: Prostate cancer  History of Present Illness: Peter Stevens a [age]o. male with prostate cancer here for fiducial marker placement and SpaceOar. Denies fevers, chills, dysuria.    Past Medical History:  Diagnosis Date   Eczema    GERD (gastroesophageal reflux disease)    Herpes simplex    Prostate cancer ([facility]  Wears contact lenses     Past Surgical History:  Procedure Laterality Date   colonscopy     9 yrs ago per pt on 01-26-2022   femur fracture surgery Left 05/24/2018   rod placed and 2 pins    Allergies:  Allergies  Allergen Reactions   Lipitor [Atorvastatin]     Increased liver function test   Penicillins Hives    Family History  Problem Relation Age of Onset   Prostate cancer Father 80 19Heart attack Father    Esophageal cancer Maternal Uncle    Cancer Paternal Uncle        unknown type   Breast cancer Maternal Grandmother 65 59 Social History:  reports that he has never smoked. He has never used smokeless tobacco. He reports current alcohol use of about 4.0 - 5.0 standard drinks of alcohol per week. He reports that he does not use drugs.  ROS: A complete review of systems was  performed.  All systems are negative except for pertinent findings as noted.  Physical Exam:  Vital signs in last 24 hours: Temp:  [97.4 F (36.3 C)] 97.4 F (36.3 C) (10/03 0658) Pulse Rate:  [55] 55 (10/03 0658) Resp:  [16] 16 (10/03 0658) BP: (146)/(85) 146/85 (10/03 0658) SpO2:  [99 %] 99 % (10/03 0658) Weight:  [83.2 kg] 83.2 kg (10/03 0658) Constitutional:  Alert and oriented, No acute distress Cardiovascular: Regular rate and rhythm Respiratory: Normal respiratory effort, Lungs clear bilaterally GI: Abdomen is soft, nontender, nondistended, no abdominal masses GU: No CVA tenderness Lymphatic: No lymphadenopathy Neurologic: Grossly intact, no focal deficits Psychiatric: Normal mood and affect  Laboratory Data:  No results for input(s): "WBC", "HGB", "HCT", "PLT" in the last 72 hours.  No results for input(s): "NA", "K", "CL", "GLUCOSE", "BUN", "CALCIUM", "CREATININE" in the last 72 hours.  Invalid input(s): "CO3"   No results found for this or any previous visit (from the past 24 hour(s)). No results found for this or any previous visit (from the past 240 hour(s)).  Renal Function: No results for input(s): "CREATININE" in the last 168 hours. CrCl cannot be calculated (No successful lab value found.).  Radiologic Imaging: No results found.  I independently reviewed the above imaging studies.  Assessment and Plan Peter Stevens is a 61 y.o. male with prostate cancer here for fiducial marker placement and SpaceOar.     Peter R. Malori Myers MD 02/10/2022, 8:01 AM  [facility] Pager: [phone]): [phone]

## 2022-02-10 NOTE — Discharge Instructions (Addendum)
Activity:  You are encouraged to ambulate frequently (about every hour during waking hours) to help prevent blood clots from forming in your legs or lungs.  However, you should not engage in any heavy lifting (> 10-15 lbs), strenuous activity, or straining.  Diet: You should advance your diet as instructed by your physician.  It will be normal to have some bloating, nausea, and abdominal discomfort intermittently.  What to call us about: You should call the office [phone]) if you develop fever > 101 or develop persistent vomiting. Activity:  You are encouraged to ambulate frequently (about every hour during waking hours) to help prevent blood clots from forming in your legs or lungs.  However, you should not engage in any heavy lifting (> 10-15 lbs), strenuous activity, or straining.  Post Anesthesia Home Care Instructions  Activity: Get plenty of rest for the remainder of the day. A responsible individual must stay with you for 24 hours following the procedure.  For the next 24 hours, DO NOT: -Drive a car -Paediatric nurse -Drink alcoholic beverages -Take any medication unless instructed by your physician -Make any legal decisions or sign important papers.  Meals: Start with liquid foods such as gelatin or soup. Progress to regular foods as tolerated. Avoid greasy, spicy, heavy foods. If nausea and/or vomiting occur, drink only clear liquids until the nausea and/or vomiting subsides. Call your physician if vomiting continues.  Special Instructions/Symptoms: Your throat may feel dry or sore from the anesthesia or the breathing tube placed in your throat during surgery. If this causes discomfort, gargle with warm salt water. The discomfort should disappear within 24 hours.  If you had a scopolamine patch placed behind your ear for the management of post- operative nausea and/or vomiting:  1. The medication in the patch is effective for 72 hours, after which it should be removed.  Wrap  patch in a tissue and discard in the trash. Wash hands thoroughly with soap and water. 2. You may remove the patch earlier than 72 hours if you experience unpleasant side effects which may include dry mouth, dizziness or visual disturbances. 3. Avoid touching the patch. Wash your hands with soap and water after contact with the patch.     May take Tylenol beginning at 1:30 PM as needed for discomfort/soreness.

## 2022-02-10 NOTE — Transfer of Care (Signed)
Immediate Anesthesia Transfer of Care Note  Patient: Peter Stevens  Procedure(s) Performed: GOLD SEED IMPLANT (Prostate) SPACE OAR INSTILLATION (Perineum)  Patient Location: PACU  Anesthesia Type:MAC  Level of Consciousness: drowsy and responds to stimulation  Airway & Oxygen Therapy: Patient Spontanous Breathing  Post-op Assessment: Report given to RN and Post -op Vital signs reviewed and stable  Post vital signs: Reviewed and stable  Last Vitals:  Vitals Value Taken Time  BP 103/70 02/10/22 0850  Temp    Pulse 52 02/10/22 0853  Resp 11 02/10/22 0853  SpO2 97 % 02/10/22 0853  Vitals shown include unvalidated device data.  Last Pain:  Vitals:   02/10/22 0658  TempSrc: Oral  PainSc: 0-No pain      Patients Stated Pain Goal: 5 (67/51/98 2429)  Complications: No notable events documented.

## 2022-02-11 ENCOUNTER — Encounter (HOSPITAL_BASED_OUTPATIENT_CLINIC_OR_DEPARTMENT_OTHER): Payer: Self-pay | Admitting: Urology

## 2022-02-11 NOTE — Progress Notes (Signed)
  Radiation Oncology         [phone] ________________________________  Name: Peter Stevens MRN: [id number]  Date: 02/12/2022  DOB: [birth year]  SIMULATION AND TREATMENT PLANNING NOTE    ICD-10-CM   1. Malignant neoplasm of prostate ([place])  C61       DIAGNOSIS:  61 y.o. gentleman with stage T1c adenocarcinoma of the prostate with a Gleason's score of 4+3 and a PSA of 5.15   NARRATIVE:  The patient was brought to the [facility].  Identity was confirmed.  All relevant records and images related to the planned course of therapy were reviewed.  The patient freely provided informed written consent to proceed with treatment after reviewing the details related to the planned course of therapy. The consent form was witnessed and verified by the simulation staff.  Then, the patient was set-up in a stable reproducible supine position for radiation therapy.  A vacuum lock pillow device was custom fabricated to position his legs in a reproducible immobilized position.  Then, I performed a urethrogram under sterile conditions to identify the prostatic apex.  CT images were obtained.  Surface markings were placed.  The CT images were loaded into the planning software.  Then the prostate target and avoidance structures including the rectum, bladder, bowel and hips were contoured.  Treatment planning then occurred.  The radiation prescription was entered and confirmed.  A total of one complex treatment devices was fabricated. I have requested : Intensity Modulated Radiotherapy (IMRT) is medically necessary for this case for the following reason:  Rectal sparing.Marland Kitchen  PLAN:   The prostate, seminal vesicles, and pelvic lymph nodes will initially be treated to 45 Gy in 25 fractions of 1.8 Gy followed by a boost to the prostate and PET equivocal left external iliac node, to 75 Gy with 15 additional fractions of 2.0 Gy   ________________________________  Sheral Apley Tammi Klippel, M.D.

## 2022-02-12 ENCOUNTER — Other Ambulatory Visit: Payer: Self-pay

## 2022-02-12 ENCOUNTER — Ambulatory Visit
Admission: RE | Admit: 2022-02-12 | Discharge: 2022-02-12 | Disposition: A | Discharge status: Home / Self Care | Payer: BC Managed Care – PPO | Source: Ambulatory Visit | Attending: Radiation Oncology | Admitting: Radiation Oncology

## 2022-02-12 DIAGNOSIS — Z191 Hormone sensitive malignancy status: Secondary | ICD-10-CM | POA: Diagnosis not present

## 2022-02-12 DIAGNOSIS — Z510 Encounter for antineoplastic radiation therapy: Secondary | ICD-10-CM | POA: Diagnosis not present

## 2022-02-12 DIAGNOSIS — C61 Malignant neoplasm of prostate: Secondary | ICD-10-CM | POA: Diagnosis not present

## 2022-02-17 DIAGNOSIS — Z510 Encounter for antineoplastic radiation therapy: Secondary | ICD-10-CM | POA: Diagnosis not present

## 2022-02-17 DIAGNOSIS — C61 Malignant neoplasm of prostate: Secondary | ICD-10-CM | POA: Diagnosis not present

## 2022-02-17 DIAGNOSIS — Z191 Hormone sensitive malignancy status: Secondary | ICD-10-CM | POA: Diagnosis not present

## 2022-02-19 ENCOUNTER — Ambulatory Visit: Payer: BC Managed Care – PPO | Admitting: Radiation Oncology

## 2022-02-20 ENCOUNTER — Ambulatory Visit
Admission: RE | Admit: 2022-02-20 | Discharge: 2022-02-20 | Disposition: A | Discharge status: Home / Self Care | Payer: No Typology Code available for payment source | Source: Ambulatory Visit | Attending: Internal Medicine | Admitting: Internal Medicine

## 2022-02-20 DIAGNOSIS — E7800 Pure hypercholesterolemia, unspecified: Secondary | ICD-10-CM

## 2022-02-23 ENCOUNTER — Ambulatory Visit
Admission: RE | Admit: 2022-02-23 | Discharge: 2022-02-23 | Disposition: A | Discharge status: Home / Self Care | Payer: BC Managed Care – PPO | Source: Ambulatory Visit | Attending: Radiation Oncology | Admitting: Radiation Oncology

## 2022-02-23 ENCOUNTER — Other Ambulatory Visit: Payer: Self-pay

## 2022-02-23 DIAGNOSIS — Z510 Encounter for antineoplastic radiation therapy: Secondary | ICD-10-CM | POA: Diagnosis not present

## 2022-02-23 DIAGNOSIS — Z191 Hormone sensitive malignancy status: Secondary | ICD-10-CM | POA: Diagnosis not present

## 2022-02-23 DIAGNOSIS — C61 Malignant neoplasm of prostate: Secondary | ICD-10-CM | POA: Diagnosis not present

## 2022-02-23 LAB — RAD ONC ARIA SESSION SUMMARY
Course Elapsed Days: 0
Plan Fractions Treated to Date: 1
Plan Prescribed Dose Per Fraction: 1.8 Gy
Plan Total Fractions Prescribed: 25
Plan Total Prescribed Dose: 45 Gy
Reference Point Dosage Given to Date: 1.8 Gy
Reference Point Session Dosage Given: 1.8 Gy
Session Number: 1

## 2022-02-24 ENCOUNTER — Ambulatory Visit
Admission: RE | Admit: 2022-02-24 | Discharge: 2022-02-24 | Disposition: A | Discharge status: Home / Self Care | Payer: BC Managed Care – PPO | Source: Ambulatory Visit | Attending: Radiation Oncology | Admitting: Radiation Oncology

## 2022-02-24 ENCOUNTER — Other Ambulatory Visit: Payer: Self-pay

## 2022-02-24 DIAGNOSIS — Z510 Encounter for antineoplastic radiation therapy: Secondary | ICD-10-CM | POA: Diagnosis not present

## 2022-02-24 DIAGNOSIS — C61 Malignant neoplasm of prostate: Secondary | ICD-10-CM | POA: Diagnosis not present

## 2022-02-24 DIAGNOSIS — Z191 Hormone sensitive malignancy status: Secondary | ICD-10-CM | POA: Diagnosis not present

## 2022-02-24 LAB — RAD ONC ARIA SESSION SUMMARY
Course Elapsed Days: 1
Plan Fractions Treated to Date: 2
Plan Prescribed Dose Per Fraction: 1.8 Gy
Plan Total Fractions Prescribed: 25
Plan Total Prescribed Dose: 45 Gy
Reference Point Dosage Given to Date: 3.6 Gy
Reference Point Session Dosage Given: 1.8 Gy
Session Number: 2

## 2022-02-25 ENCOUNTER — Other Ambulatory Visit: Payer: Self-pay

## 2022-02-25 ENCOUNTER — Ambulatory Visit
Admission: RE | Admit: 2022-02-25 | Discharge: 2022-02-25 | Disposition: A | Discharge status: Home / Self Care | Payer: BC Managed Care – PPO | Source: Ambulatory Visit | Attending: Radiation Oncology | Admitting: Radiation Oncology

## 2022-02-25 DIAGNOSIS — Z191 Hormone sensitive malignancy status: Secondary | ICD-10-CM | POA: Diagnosis not present

## 2022-02-25 DIAGNOSIS — C61 Malignant neoplasm of prostate: Secondary | ICD-10-CM | POA: Diagnosis not present

## 2022-02-25 DIAGNOSIS — Z510 Encounter for antineoplastic radiation therapy: Secondary | ICD-10-CM | POA: Diagnosis not present

## 2022-02-25 LAB — RAD ONC ARIA SESSION SUMMARY
Course Elapsed Days: 2
Plan Fractions Treated to Date: 3
Plan Prescribed Dose Per Fraction: 1.8 Gy
Plan Total Fractions Prescribed: 25
Plan Total Prescribed Dose: 45 Gy
Reference Point Dosage Given to Date: 5.4 Gy
Reference Point Session Dosage Given: 1.8 Gy
Session Number: 3

## 2022-02-26 ENCOUNTER — Ambulatory Visit
Admission: RE | Admit: 2022-02-26 | Discharge: 2022-02-26 | Disposition: A | Discharge status: Home / Self Care | Payer: BC Managed Care – PPO | Source: Ambulatory Visit | Attending: Radiation Oncology | Admitting: Radiation Oncology

## 2022-02-26 ENCOUNTER — Other Ambulatory Visit: Payer: Self-pay

## 2022-02-26 DIAGNOSIS — Z5111 Encounter for antineoplastic chemotherapy: Secondary | ICD-10-CM | POA: Diagnosis not present

## 2022-02-26 DIAGNOSIS — C61 Malignant neoplasm of prostate: Secondary | ICD-10-CM | POA: Diagnosis not present

## 2022-02-26 DIAGNOSIS — Z191 Hormone sensitive malignancy status: Secondary | ICD-10-CM | POA: Diagnosis not present

## 2022-02-26 DIAGNOSIS — Z510 Encounter for antineoplastic radiation therapy: Secondary | ICD-10-CM | POA: Diagnosis not present

## 2022-02-26 LAB — RAD ONC ARIA SESSION SUMMARY
Course Elapsed Days: 3
Plan Fractions Treated to Date: 4
Plan Prescribed Dose Per Fraction: 1.8 Gy
Plan Total Fractions Prescribed: 25
Plan Total Prescribed Dose: 45 Gy
Reference Point Dosage Given to Date: 7.2 Gy
Reference Point Session Dosage Given: 1.8 Gy
Session Number: 4

## 2022-02-27 ENCOUNTER — Ambulatory Visit
Admission: RE | Admit: 2022-02-27 | Discharge: 2022-02-27 | Disposition: A | Discharge status: Home / Self Care | Payer: BC Managed Care – PPO | Source: Ambulatory Visit | Attending: Radiation Oncology | Admitting: Radiation Oncology

## 2022-02-27 ENCOUNTER — Other Ambulatory Visit: Payer: Self-pay

## 2022-02-27 DIAGNOSIS — Z510 Encounter for antineoplastic radiation therapy: Secondary | ICD-10-CM | POA: Diagnosis not present

## 2022-02-27 DIAGNOSIS — Z191 Hormone sensitive malignancy status: Secondary | ICD-10-CM | POA: Diagnosis not present

## 2022-02-27 DIAGNOSIS — C61 Malignant neoplasm of prostate: Secondary | ICD-10-CM

## 2022-02-27 LAB — RAD ONC ARIA SESSION SUMMARY
Course Elapsed Days: 4
Plan Fractions Treated to Date: 5
Plan Prescribed Dose Per Fraction: 1.8 Gy
Plan Total Fractions Prescribed: 25
Plan Total Prescribed Dose: 45 Gy
Reference Point Dosage Given to Date: 9 Gy
Reference Point Session Dosage Given: 1.8 Gy
Session Number: 5

## 2022-02-27 MED ORDER — SONAFINE EX EMUL
1.0000 | Freq: Two times a day (BID) | CUTANEOUS | Status: DC
  Administered 2022-02-27: 1 via TOPICAL

## 2022-02-27 NOTE — Progress Notes (Signed)
Pt here for patient teaching.  Pt given Radiation and You booklet, skin care instructions, and Sonafine.  Reviewed areas of pertinence such as diarrhea, fatigue, hair loss, nausea and vomiting, sexual and fertility changes, skin changes, urinary and bladder changes, and taste changes . Pt able to give teach back of to pat skin, use unscented/gentle soap, use baby wipes, have Imodium on hand, drink plenty of water, and sitz bath,apply Sonafine bid, avoid applying anything to skin within 4 hours of treatment, and to use an electric razor if they must shave. Pt verbalizes understanding of information given and will contact nursing with any questions or concerns.     [email or web address]/treatmentinformation/whattoexpect/index      

## 2022-03-02 ENCOUNTER — Other Ambulatory Visit: Payer: Self-pay

## 2022-03-02 ENCOUNTER — Ambulatory Visit
Admission: RE | Admit: 2022-03-02 | Discharge: 2022-03-02 | Disposition: A | Discharge status: Home / Self Care | Payer: BC Managed Care – PPO | Source: Ambulatory Visit | Attending: Radiation Oncology | Admitting: Radiation Oncology

## 2022-03-02 DIAGNOSIS — Z510 Encounter for antineoplastic radiation therapy: Secondary | ICD-10-CM | POA: Diagnosis not present

## 2022-03-02 DIAGNOSIS — Z191 Hormone sensitive malignancy status: Secondary | ICD-10-CM | POA: Diagnosis not present

## 2022-03-02 DIAGNOSIS — C61 Malignant neoplasm of prostate: Secondary | ICD-10-CM | POA: Diagnosis not present

## 2022-03-02 LAB — RAD ONC ARIA SESSION SUMMARY
Course Elapsed Days: 7
Plan Fractions Treated to Date: 6
Plan Prescribed Dose Per Fraction: 1.8 Gy
Plan Total Fractions Prescribed: 25
Plan Total Prescribed Dose: 45 Gy
Reference Point Dosage Given to Date: 10.8 Gy
Reference Point Session Dosage Given: 1.8 Gy
Session Number: 6

## 2022-03-03 ENCOUNTER — Other Ambulatory Visit: Payer: Self-pay

## 2022-03-03 ENCOUNTER — Ambulatory Visit
Admission: RE | Admit: 2022-03-03 | Discharge: 2022-03-03 | Disposition: A | Discharge status: Home / Self Care | Payer: BC Managed Care – PPO | Source: Ambulatory Visit | Attending: Radiation Oncology | Admitting: Radiation Oncology

## 2022-03-03 DIAGNOSIS — Z191 Hormone sensitive malignancy status: Secondary | ICD-10-CM | POA: Diagnosis not present

## 2022-03-03 DIAGNOSIS — Z510 Encounter for antineoplastic radiation therapy: Secondary | ICD-10-CM | POA: Diagnosis not present

## 2022-03-03 DIAGNOSIS — C61 Malignant neoplasm of prostate: Secondary | ICD-10-CM | POA: Diagnosis not present

## 2022-03-03 LAB — RAD ONC ARIA SESSION SUMMARY
Course Elapsed Days: 8
Plan Fractions Treated to Date: 7
Plan Prescribed Dose Per Fraction: 1.8 Gy
Plan Total Fractions Prescribed: 25
Plan Total Prescribed Dose: 45 Gy
Reference Point Dosage Given to Date: 12.6 Gy
Reference Point Session Dosage Given: 1.8 Gy
Session Number: 7

## 2022-03-04 ENCOUNTER — Other Ambulatory Visit: Payer: Self-pay

## 2022-03-04 ENCOUNTER — Ambulatory Visit
Admission: RE | Admit: 2022-03-04 | Discharge: 2022-03-04 | Disposition: A | Discharge status: Home / Self Care | Payer: BC Managed Care – PPO | Source: Ambulatory Visit | Attending: Radiation Oncology | Admitting: Radiation Oncology

## 2022-03-04 DIAGNOSIS — Z510 Encounter for antineoplastic radiation therapy: Secondary | ICD-10-CM | POA: Diagnosis not present

## 2022-03-04 DIAGNOSIS — Z191 Hormone sensitive malignancy status: Secondary | ICD-10-CM | POA: Diagnosis not present

## 2022-03-04 DIAGNOSIS — C61 Malignant neoplasm of prostate: Secondary | ICD-10-CM | POA: Diagnosis not present

## 2022-03-04 LAB — RAD ONC ARIA SESSION SUMMARY
Course Elapsed Days: 9
Plan Fractions Treated to Date: 8
Plan Prescribed Dose Per Fraction: 1.8 Gy
Plan Total Fractions Prescribed: 25
Plan Total Prescribed Dose: 45 Gy
Reference Point Dosage Given to Date: 14.4 Gy
Reference Point Session Dosage Given: 1.8 Gy
Session Number: 8

## 2022-03-05 ENCOUNTER — Other Ambulatory Visit: Payer: Self-pay

## 2022-03-05 ENCOUNTER — Ambulatory Visit
Admission: RE | Admit: 2022-03-05 | Discharge: 2022-03-05 | Disposition: A | Discharge status: Home / Self Care | Payer: BC Managed Care – PPO | Source: Ambulatory Visit | Attending: Radiation Oncology | Admitting: Radiation Oncology

## 2022-03-05 DIAGNOSIS — Z191 Hormone sensitive malignancy status: Secondary | ICD-10-CM | POA: Diagnosis not present

## 2022-03-05 DIAGNOSIS — E7800 Pure hypercholesterolemia, unspecified: Secondary | ICD-10-CM | POA: Diagnosis not present

## 2022-03-05 DIAGNOSIS — Z510 Encounter for antineoplastic radiation therapy: Secondary | ICD-10-CM | POA: Diagnosis not present

## 2022-03-05 DIAGNOSIS — I10 Essential (primary) hypertension: Secondary | ICD-10-CM | POA: Diagnosis not present

## 2022-03-05 DIAGNOSIS — C61 Malignant neoplasm of prostate: Secondary | ICD-10-CM | POA: Diagnosis not present

## 2022-03-05 LAB — RAD ONC ARIA SESSION SUMMARY
Course Elapsed Days: 10
Plan Fractions Treated to Date: 9
Plan Prescribed Dose Per Fraction: 1.8 Gy
Plan Total Fractions Prescribed: 25
Plan Total Prescribed Dose: 45 Gy
Reference Point Dosage Given to Date: 16.2 Gy
Reference Point Session Dosage Given: 1.8 Gy
Session Number: 9

## 2022-03-06 ENCOUNTER — Ambulatory Visit
Admission: RE | Admit: 2022-03-06 | Discharge: 2022-03-06 | Disposition: A | Discharge status: Home / Self Care | Payer: BC Managed Care – PPO | Source: Ambulatory Visit | Attending: Radiation Oncology | Admitting: Radiation Oncology

## 2022-03-06 ENCOUNTER — Other Ambulatory Visit: Payer: Self-pay

## 2022-03-06 DIAGNOSIS — Z191 Hormone sensitive malignancy status: Secondary | ICD-10-CM | POA: Diagnosis not present

## 2022-03-06 DIAGNOSIS — C61 Malignant neoplasm of prostate: Secondary | ICD-10-CM | POA: Diagnosis not present

## 2022-03-06 DIAGNOSIS — Z510 Encounter for antineoplastic radiation therapy: Secondary | ICD-10-CM | POA: Diagnosis not present

## 2022-03-06 LAB — RAD ONC ARIA SESSION SUMMARY
Course Elapsed Days: 11
Plan Fractions Treated to Date: 10
Plan Prescribed Dose Per Fraction: 1.8 Gy
Plan Total Fractions Prescribed: 25
Plan Total Prescribed Dose: 45 Gy
Reference Point Dosage Given to Date: 18 Gy
Reference Point Session Dosage Given: 1.8 Gy
Session Number: 10

## 2022-03-09 ENCOUNTER — Other Ambulatory Visit: Payer: Self-pay

## 2022-03-09 ENCOUNTER — Ambulatory Visit
Admission: RE | Admit: 2022-03-09 | Discharge: 2022-03-09 | Disposition: A | Discharge status: Home / Self Care | Payer: BC Managed Care – PPO | Source: Ambulatory Visit | Attending: Radiation Oncology | Admitting: Radiation Oncology

## 2022-03-09 DIAGNOSIS — C61 Malignant neoplasm of prostate: Secondary | ICD-10-CM | POA: Diagnosis not present

## 2022-03-09 DIAGNOSIS — Z510 Encounter for antineoplastic radiation therapy: Secondary | ICD-10-CM | POA: Diagnosis not present

## 2022-03-09 DIAGNOSIS — Z191 Hormone sensitive malignancy status: Secondary | ICD-10-CM | POA: Diagnosis not present

## 2022-03-09 LAB — RAD ONC ARIA SESSION SUMMARY
Course Elapsed Days: 14
Plan Fractions Treated to Date: 11
Plan Prescribed Dose Per Fraction: 1.8 Gy
Plan Total Fractions Prescribed: 25
Plan Total Prescribed Dose: 45 Gy
Reference Point Dosage Given to Date: 19.8 Gy
Reference Point Session Dosage Given: 1.8 Gy
Session Number: 11

## 2022-03-10 ENCOUNTER — Ambulatory Visit
Admission: RE | Admit: 2022-03-10 | Discharge: 2022-03-10 | Disposition: A | Discharge status: Home / Self Care | Payer: BC Managed Care – PPO | Source: Ambulatory Visit | Attending: Radiation Oncology | Admitting: Radiation Oncology

## 2022-03-10 ENCOUNTER — Other Ambulatory Visit: Payer: Self-pay

## 2022-03-10 DIAGNOSIS — Z191 Hormone sensitive malignancy status: Secondary | ICD-10-CM | POA: Diagnosis not present

## 2022-03-10 DIAGNOSIS — C61 Malignant neoplasm of prostate: Secondary | ICD-10-CM | POA: Diagnosis not present

## 2022-03-10 DIAGNOSIS — Z510 Encounter for antineoplastic radiation therapy: Secondary | ICD-10-CM | POA: Diagnosis not present

## 2022-03-10 LAB — RAD ONC ARIA SESSION SUMMARY
Course Elapsed Days: 15
Plan Fractions Treated to Date: 12
Plan Prescribed Dose Per Fraction: 1.8 Gy
Plan Total Fractions Prescribed: 25
Plan Total Prescribed Dose: 45 Gy
Reference Point Dosage Given to Date: 21.6 Gy
Reference Point Session Dosage Given: 1.8 Gy
Session Number: 12

## 2022-03-11 ENCOUNTER — Ambulatory Visit
Admission: RE | Admit: 2022-03-11 | Discharge: 2022-03-11 | Disposition: A | Discharge status: Home / Self Care | Payer: BC Managed Care – PPO | Source: Ambulatory Visit | Attending: Radiation Oncology | Admitting: Radiation Oncology

## 2022-03-11 ENCOUNTER — Other Ambulatory Visit: Payer: Self-pay | Admitting: Urology

## 2022-03-11 ENCOUNTER — Other Ambulatory Visit: Payer: Self-pay

## 2022-03-11 DIAGNOSIS — C61 Malignant neoplasm of prostate: Secondary | ICD-10-CM | POA: Insufficient documentation

## 2022-03-11 DIAGNOSIS — Z191 Hormone sensitive malignancy status: Secondary | ICD-10-CM | POA: Diagnosis not present

## 2022-03-11 DIAGNOSIS — Z510 Encounter for antineoplastic radiation therapy: Secondary | ICD-10-CM | POA: Insufficient documentation

## 2022-03-11 LAB — RAD ONC ARIA SESSION SUMMARY
Course Elapsed Days: 16
Plan Fractions Treated to Date: 13
Plan Prescribed Dose Per Fraction: 1.8 Gy
Plan Total Fractions Prescribed: 25
Plan Total Prescribed Dose: 45 Gy
Reference Point Dosage Given to Date: 23.4 Gy
Reference Point Session Dosage Given: 1.8 Gy
Session Number: 13

## 2022-03-11 MED ORDER — TAMSULOSIN HCL 0.4 MG PO CAPS: 0.4000 mg | ORAL_CAPSULE | Freq: Every day | ORAL | 5 refills | Status: AC

## 2022-03-12 ENCOUNTER — Other Ambulatory Visit: Payer: Self-pay

## 2022-03-12 ENCOUNTER — Ambulatory Visit
Admission: RE | Admit: 2022-03-12 | Discharge: 2022-03-12 | Disposition: A | Discharge status: Home / Self Care | Payer: BC Managed Care – PPO | Source: Ambulatory Visit | Attending: Radiation Oncology | Admitting: Radiation Oncology

## 2022-03-12 DIAGNOSIS — Z510 Encounter for antineoplastic radiation therapy: Secondary | ICD-10-CM | POA: Diagnosis not present

## 2022-03-12 DIAGNOSIS — C61 Malignant neoplasm of prostate: Secondary | ICD-10-CM | POA: Diagnosis not present

## 2022-03-12 DIAGNOSIS — Z191 Hormone sensitive malignancy status: Secondary | ICD-10-CM | POA: Diagnosis not present

## 2022-03-12 LAB — RAD ONC ARIA SESSION SUMMARY
Course Elapsed Days: 17
Plan Fractions Treated to Date: 14
Plan Prescribed Dose Per Fraction: 1.8 Gy
Plan Total Fractions Prescribed: 25
Plan Total Prescribed Dose: 45 Gy
Reference Point Dosage Given to Date: 25.2 Gy
Reference Point Session Dosage Given: 1.8 Gy
Session Number: 14

## 2022-03-13 ENCOUNTER — Other Ambulatory Visit: Payer: Self-pay

## 2022-03-13 ENCOUNTER — Ambulatory Visit
Admission: RE | Admit: 2022-03-13 | Discharge: 2022-03-13 | Disposition: A | Discharge status: Home / Self Care | Payer: BC Managed Care – PPO | Source: Ambulatory Visit | Attending: Radiation Oncology | Admitting: Radiation Oncology

## 2022-03-13 DIAGNOSIS — Z191 Hormone sensitive malignancy status: Secondary | ICD-10-CM | POA: Diagnosis not present

## 2022-03-13 DIAGNOSIS — C61 Malignant neoplasm of prostate: Secondary | ICD-10-CM | POA: Diagnosis not present

## 2022-03-13 DIAGNOSIS — Z510 Encounter for antineoplastic radiation therapy: Secondary | ICD-10-CM | POA: Diagnosis not present

## 2022-03-13 LAB — RAD ONC ARIA SESSION SUMMARY
Course Elapsed Days: 18
Plan Fractions Treated to Date: 15
Plan Prescribed Dose Per Fraction: 1.8 Gy
Plan Total Fractions Prescribed: 25
Plan Total Prescribed Dose: 45 Gy
Reference Point Dosage Given to Date: 27 Gy
Reference Point Session Dosage Given: 1.8 Gy
Session Number: 15

## 2022-03-16 ENCOUNTER — Other Ambulatory Visit: Payer: Self-pay

## 2022-03-16 ENCOUNTER — Ambulatory Visit
Admission: RE | Admit: 2022-03-16 | Discharge: 2022-03-16 | Disposition: A | Discharge status: Home / Self Care | Payer: BC Managed Care – PPO | Source: Ambulatory Visit | Attending: Radiation Oncology | Admitting: Radiation Oncology

## 2022-03-16 DIAGNOSIS — Z510 Encounter for antineoplastic radiation therapy: Secondary | ICD-10-CM | POA: Diagnosis not present

## 2022-03-16 DIAGNOSIS — C61 Malignant neoplasm of prostate: Secondary | ICD-10-CM | POA: Diagnosis not present

## 2022-03-16 DIAGNOSIS — Z191 Hormone sensitive malignancy status: Secondary | ICD-10-CM | POA: Diagnosis not present

## 2022-03-16 LAB — RAD ONC ARIA SESSION SUMMARY
Course Elapsed Days: 21
Plan Fractions Treated to Date: 16
Plan Prescribed Dose Per Fraction: 1.8 Gy
Plan Total Fractions Prescribed: 25
Plan Total Prescribed Dose: 45 Gy
Reference Point Dosage Given to Date: 28.8 Gy
Reference Point Session Dosage Given: 1.8 Gy
Session Number: 16

## 2022-03-17 ENCOUNTER — Ambulatory Visit
Admission: RE | Admit: 2022-03-17 | Discharge: 2022-03-17 | Disposition: A | Discharge status: Home / Self Care | Payer: BC Managed Care – PPO | Source: Ambulatory Visit | Attending: Radiation Oncology | Admitting: Radiation Oncology

## 2022-03-17 ENCOUNTER — Other Ambulatory Visit: Payer: Self-pay

## 2022-03-17 DIAGNOSIS — Z191 Hormone sensitive malignancy status: Secondary | ICD-10-CM | POA: Diagnosis not present

## 2022-03-17 DIAGNOSIS — C61 Malignant neoplasm of prostate: Secondary | ICD-10-CM | POA: Diagnosis not present

## 2022-03-17 DIAGNOSIS — Z510 Encounter for antineoplastic radiation therapy: Secondary | ICD-10-CM | POA: Diagnosis not present

## 2022-03-17 LAB — RAD ONC ARIA SESSION SUMMARY
Course Elapsed Days: 22
Plan Fractions Treated to Date: 17
Plan Prescribed Dose Per Fraction: 1.8 Gy
Plan Total Fractions Prescribed: 25
Plan Total Prescribed Dose: 45 Gy
Reference Point Dosage Given to Date: 30.6 Gy
Reference Point Session Dosage Given: 1.8 Gy
Session Number: 17

## 2022-03-18 ENCOUNTER — Other Ambulatory Visit: Payer: Self-pay

## 2022-03-18 ENCOUNTER — Ambulatory Visit
Admission: RE | Admit: 2022-03-18 | Discharge: 2022-03-18 | Disposition: A | Discharge status: Home / Self Care | Payer: BC Managed Care – PPO | Source: Ambulatory Visit | Attending: Radiation Oncology | Admitting: Radiation Oncology

## 2022-03-18 DIAGNOSIS — Z191 Hormone sensitive malignancy status: Secondary | ICD-10-CM | POA: Diagnosis not present

## 2022-03-18 DIAGNOSIS — C61 Malignant neoplasm of prostate: Secondary | ICD-10-CM | POA: Diagnosis not present

## 2022-03-18 DIAGNOSIS — Z510 Encounter for antineoplastic radiation therapy: Secondary | ICD-10-CM | POA: Diagnosis not present

## 2022-03-18 LAB — RAD ONC ARIA SESSION SUMMARY
Course Elapsed Days: 23
Plan Fractions Treated to Date: 18
Plan Prescribed Dose Per Fraction: 1.8 Gy
Plan Total Fractions Prescribed: 25
Plan Total Prescribed Dose: 45 Gy
Reference Point Dosage Given to Date: 32.4 Gy
Reference Point Session Dosage Given: 1.8 Gy
Session Number: 18

## 2022-03-19 ENCOUNTER — Other Ambulatory Visit: Payer: Self-pay

## 2022-03-19 ENCOUNTER — Ambulatory Visit
Admission: RE | Admit: 2022-03-19 | Discharge: 2022-03-19 | Disposition: A | Discharge status: Home / Self Care | Payer: BC Managed Care – PPO | Source: Ambulatory Visit | Attending: Radiation Oncology | Admitting: Radiation Oncology

## 2022-03-19 DIAGNOSIS — Z191 Hormone sensitive malignancy status: Secondary | ICD-10-CM | POA: Diagnosis not present

## 2022-03-19 DIAGNOSIS — C61 Malignant neoplasm of prostate: Secondary | ICD-10-CM | POA: Diagnosis not present

## 2022-03-19 DIAGNOSIS — Z510 Encounter for antineoplastic radiation therapy: Secondary | ICD-10-CM | POA: Diagnosis not present

## 2022-03-19 LAB — RAD ONC ARIA SESSION SUMMARY
Course Elapsed Days: 24
Plan Fractions Treated to Date: 19
Plan Prescribed Dose Per Fraction: 1.8 Gy
Plan Total Fractions Prescribed: 25
Plan Total Prescribed Dose: 45 Gy
Reference Point Dosage Given to Date: 34.2 Gy
Reference Point Session Dosage Given: 1.8 Gy
Session Number: 19

## 2022-03-20 ENCOUNTER — Other Ambulatory Visit: Payer: Self-pay

## 2022-03-20 ENCOUNTER — Ambulatory Visit
Admission: RE | Admit: 2022-03-20 | Discharge: 2022-03-20 | Disposition: A | Discharge status: Home / Self Care | Payer: BC Managed Care – PPO | Source: Ambulatory Visit | Attending: Radiation Oncology | Admitting: Radiation Oncology

## 2022-03-20 DIAGNOSIS — C61 Malignant neoplasm of prostate: Secondary | ICD-10-CM | POA: Diagnosis not present

## 2022-03-20 DIAGNOSIS — Z191 Hormone sensitive malignancy status: Secondary | ICD-10-CM | POA: Diagnosis not present

## 2022-03-20 DIAGNOSIS — Z510 Encounter for antineoplastic radiation therapy: Secondary | ICD-10-CM | POA: Diagnosis not present

## 2022-03-20 LAB — RAD ONC ARIA SESSION SUMMARY
Course Elapsed Days: 25
Plan Fractions Treated to Date: 20
Plan Prescribed Dose Per Fraction: 1.8 Gy
Plan Total Fractions Prescribed: 25
Plan Total Prescribed Dose: 45 Gy
Reference Point Dosage Given to Date: 36 Gy
Reference Point Session Dosage Given: 1.8 Gy
Session Number: 20

## 2022-03-23 ENCOUNTER — Other Ambulatory Visit: Payer: Self-pay

## 2022-03-23 ENCOUNTER — Ambulatory Visit
Admission: RE | Admit: 2022-03-23 | Discharge: 2022-03-23 | Disposition: A | Discharge status: Home / Self Care | Payer: BC Managed Care – PPO | Source: Ambulatory Visit | Attending: Radiation Oncology | Admitting: Radiation Oncology

## 2022-03-23 DIAGNOSIS — Z510 Encounter for antineoplastic radiation therapy: Secondary | ICD-10-CM | POA: Diagnosis not present

## 2022-03-23 DIAGNOSIS — Z191 Hormone sensitive malignancy status: Secondary | ICD-10-CM | POA: Diagnosis not present

## 2022-03-23 DIAGNOSIS — C61 Malignant neoplasm of prostate: Secondary | ICD-10-CM | POA: Diagnosis not present

## 2022-03-23 LAB — RAD ONC ARIA SESSION SUMMARY
Course Elapsed Days: 28
Plan Fractions Treated to Date: 21
Plan Prescribed Dose Per Fraction: 1.8 Gy
Plan Total Fractions Prescribed: 25
Plan Total Prescribed Dose: 45 Gy
Reference Point Dosage Given to Date: 37.8 Gy
Reference Point Session Dosage Given: 1.8 Gy
Session Number: 21

## 2022-03-24 ENCOUNTER — Ambulatory Visit
Admission: RE | Admit: 2022-03-24 | Discharge: 2022-03-24 | Disposition: A | Discharge status: Home / Self Care | Payer: BC Managed Care – PPO | Source: Ambulatory Visit | Attending: Radiation Oncology | Admitting: Radiation Oncology

## 2022-03-24 ENCOUNTER — Other Ambulatory Visit: Payer: Self-pay

## 2022-03-24 DIAGNOSIS — Z510 Encounter for antineoplastic radiation therapy: Secondary | ICD-10-CM | POA: Diagnosis not present

## 2022-03-24 DIAGNOSIS — C61 Malignant neoplasm of prostate: Secondary | ICD-10-CM | POA: Diagnosis not present

## 2022-03-24 DIAGNOSIS — Z191 Hormone sensitive malignancy status: Secondary | ICD-10-CM | POA: Diagnosis not present

## 2022-03-24 LAB — RAD ONC ARIA SESSION SUMMARY
Course Elapsed Days: 29
Plan Fractions Treated to Date: 22
Plan Prescribed Dose Per Fraction: 1.8 Gy
Plan Total Fractions Prescribed: 25
Plan Total Prescribed Dose: 45 Gy
Reference Point Dosage Given to Date: 39.6 Gy
Reference Point Session Dosage Given: 1.8 Gy
Session Number: 22

## 2022-03-25 ENCOUNTER — Other Ambulatory Visit: Payer: Self-pay

## 2022-03-25 ENCOUNTER — Ambulatory Visit
Admission: RE | Admit: 2022-03-25 | Discharge: 2022-03-25 | Disposition: A | Discharge status: Home / Self Care | Payer: BC Managed Care – PPO | Source: Ambulatory Visit | Attending: Radiation Oncology | Admitting: Radiation Oncology

## 2022-03-25 DIAGNOSIS — Z510 Encounter for antineoplastic radiation therapy: Secondary | ICD-10-CM | POA: Diagnosis not present

## 2022-03-25 DIAGNOSIS — C61 Malignant neoplasm of prostate: Secondary | ICD-10-CM | POA: Diagnosis not present

## 2022-03-25 DIAGNOSIS — Z191 Hormone sensitive malignancy status: Secondary | ICD-10-CM | POA: Diagnosis not present

## 2022-03-25 LAB — RAD ONC ARIA SESSION SUMMARY
Course Elapsed Days: 30
Plan Fractions Treated to Date: 23
Plan Prescribed Dose Per Fraction: 1.8 Gy
Plan Total Fractions Prescribed: 25
Plan Total Prescribed Dose: 45 Gy
Reference Point Dosage Given to Date: 41.4 Gy
Reference Point Session Dosage Given: 1.8 Gy
Session Number: 23

## 2022-03-26 ENCOUNTER — Ambulatory Visit
Admission: RE | Admit: 2022-03-26 | Discharge: 2022-03-26 | Disposition: A | Discharge status: Home / Self Care | Payer: BC Managed Care – PPO | Source: Ambulatory Visit | Attending: Radiation Oncology | Admitting: Radiation Oncology

## 2022-03-26 ENCOUNTER — Other Ambulatory Visit: Payer: Self-pay

## 2022-03-26 DIAGNOSIS — Z191 Hormone sensitive malignancy status: Secondary | ICD-10-CM | POA: Diagnosis not present

## 2022-03-26 DIAGNOSIS — C61 Malignant neoplasm of prostate: Secondary | ICD-10-CM | POA: Diagnosis not present

## 2022-03-26 DIAGNOSIS — Z510 Encounter for antineoplastic radiation therapy: Secondary | ICD-10-CM | POA: Diagnosis not present

## 2022-03-26 LAB — RAD ONC ARIA SESSION SUMMARY
Course Elapsed Days: 31
Plan Fractions Treated to Date: 24
Plan Prescribed Dose Per Fraction: 1.8 Gy
Plan Total Fractions Prescribed: 25
Plan Total Prescribed Dose: 45 Gy
Reference Point Dosage Given to Date: 43.2 Gy
Reference Point Session Dosage Given: 1.8 Gy
Session Number: 24

## 2022-03-27 ENCOUNTER — Ambulatory Visit
Admission: RE | Admit: 2022-03-27 | Discharge: 2022-03-27 | Disposition: A | Discharge status: Home / Self Care | Payer: BC Managed Care – PPO | Source: Ambulatory Visit | Attending: Radiation Oncology | Admitting: Radiation Oncology

## 2022-03-27 ENCOUNTER — Other Ambulatory Visit: Payer: Self-pay | Admitting: Radiation Oncology

## 2022-03-27 ENCOUNTER — Other Ambulatory Visit: Payer: Self-pay

## 2022-03-27 DIAGNOSIS — Z191 Hormone sensitive malignancy status: Secondary | ICD-10-CM | POA: Diagnosis not present

## 2022-03-27 DIAGNOSIS — C61 Malignant neoplasm of prostate: Secondary | ICD-10-CM | POA: Diagnosis not present

## 2022-03-27 DIAGNOSIS — Z510 Encounter for antineoplastic radiation therapy: Secondary | ICD-10-CM | POA: Diagnosis not present

## 2022-03-27 LAB — RAD ONC ARIA SESSION SUMMARY
Course Elapsed Days: 32
Plan Fractions Treated to Date: 25
Plan Prescribed Dose Per Fraction: 1.8 Gy
Plan Total Fractions Prescribed: 25
Plan Total Prescribed Dose: 45 Gy
Reference Point Dosage Given to Date: 45 Gy
Reference Point Session Dosage Given: 1.8 Gy
Session Number: 25

## 2022-03-27 MED ORDER — SILDENAFIL CITRATE 50 MG PO TABS: 100.0000 mg | ORAL_TABLET | Freq: Every day | ORAL | 15 refills | Status: AC | PRN

## 2022-03-29 ENCOUNTER — Other Ambulatory Visit: Payer: Self-pay

## 2022-03-29 ENCOUNTER — Ambulatory Visit
Admission: RE | Admit: 2022-03-29 | Discharge: 2022-03-29 | Disposition: A | Discharge status: Home / Self Care | Payer: BC Managed Care – PPO | Source: Ambulatory Visit | Attending: Radiation Oncology | Admitting: Radiation Oncology

## 2022-03-29 DIAGNOSIS — Z510 Encounter for antineoplastic radiation therapy: Secondary | ICD-10-CM | POA: Diagnosis not present

## 2022-03-29 DIAGNOSIS — C61 Malignant neoplasm of prostate: Secondary | ICD-10-CM | POA: Diagnosis not present

## 2022-03-29 DIAGNOSIS — Z191 Hormone sensitive malignancy status: Secondary | ICD-10-CM | POA: Diagnosis not present

## 2022-03-29 LAB — RAD ONC ARIA SESSION SUMMARY
Course Elapsed Days: 34
Plan Fractions Treated to Date: 1
Plan Prescribed Dose Per Fraction: 2 Gy
Plan Total Fractions Prescribed: 15
Plan Total Prescribed Dose: 30 Gy
Reference Point Dosage Given to Date: 2 Gy
Reference Point Session Dosage Given: 2 Gy
Session Number: 26

## 2022-03-30 ENCOUNTER — Other Ambulatory Visit: Payer: Self-pay

## 2022-03-30 ENCOUNTER — Ambulatory Visit: Payer: BC Managed Care – PPO

## 2022-03-30 ENCOUNTER — Ambulatory Visit
Admission: RE | Admit: 2022-03-30 | Discharge: 2022-03-30 | Disposition: A | Discharge status: Home / Self Care | Payer: BC Managed Care – PPO | Source: Ambulatory Visit | Attending: Radiation Oncology | Admitting: Radiation Oncology

## 2022-03-30 DIAGNOSIS — Z510 Encounter for antineoplastic radiation therapy: Secondary | ICD-10-CM | POA: Diagnosis not present

## 2022-03-30 DIAGNOSIS — C61 Malignant neoplasm of prostate: Secondary | ICD-10-CM | POA: Diagnosis not present

## 2022-03-30 DIAGNOSIS — Z191 Hormone sensitive malignancy status: Secondary | ICD-10-CM | POA: Diagnosis not present

## 2022-03-30 LAB — RAD ONC ARIA SESSION SUMMARY
Course Elapsed Days: 35
Plan Fractions Treated to Date: 2
Plan Prescribed Dose Per Fraction: 2 Gy
Plan Total Fractions Prescribed: 15
Plan Total Prescribed Dose: 30 Gy
Reference Point Dosage Given to Date: 4 Gy
Reference Point Session Dosage Given: 2 Gy
Session Number: 27

## 2022-03-31 ENCOUNTER — Ambulatory Visit
Admission: RE | Admit: 2022-03-31 | Discharge: 2022-03-31 | Disposition: A | Discharge status: Home / Self Care | Payer: BC Managed Care – PPO | Source: Ambulatory Visit | Attending: Radiation Oncology | Admitting: Radiation Oncology

## 2022-03-31 ENCOUNTER — Other Ambulatory Visit: Payer: Self-pay

## 2022-03-31 DIAGNOSIS — Z191 Hormone sensitive malignancy status: Secondary | ICD-10-CM | POA: Diagnosis not present

## 2022-03-31 DIAGNOSIS — Z510 Encounter for antineoplastic radiation therapy: Secondary | ICD-10-CM | POA: Diagnosis not present

## 2022-03-31 DIAGNOSIS — C61 Malignant neoplasm of prostate: Secondary | ICD-10-CM | POA: Diagnosis not present

## 2022-03-31 LAB — RAD ONC ARIA SESSION SUMMARY
Course Elapsed Days: 36
Plan Fractions Treated to Date: 3
Plan Prescribed Dose Per Fraction: 2 Gy
Plan Total Fractions Prescribed: 15
Plan Total Prescribed Dose: 30 Gy
Reference Point Dosage Given to Date: 6 Gy
Reference Point Session Dosage Given: 2 Gy
Session Number: 28

## 2022-04-01 ENCOUNTER — Other Ambulatory Visit: Payer: Self-pay

## 2022-04-01 ENCOUNTER — Ambulatory Visit: Payer: BC Managed Care – PPO

## 2022-04-01 ENCOUNTER — Ambulatory Visit
Admission: RE | Admit: 2022-04-01 | Discharge: 2022-04-01 | Disposition: A | Discharge status: Home / Self Care | Payer: BC Managed Care – PPO | Source: Ambulatory Visit | Attending: Radiation Oncology | Admitting: Radiation Oncology

## 2022-04-01 DIAGNOSIS — Z510 Encounter for antineoplastic radiation therapy: Secondary | ICD-10-CM | POA: Diagnosis not present

## 2022-04-01 DIAGNOSIS — Z191 Hormone sensitive malignancy status: Secondary | ICD-10-CM | POA: Diagnosis not present

## 2022-04-01 DIAGNOSIS — C61 Malignant neoplasm of prostate: Secondary | ICD-10-CM | POA: Diagnosis not present

## 2022-04-01 LAB — RAD ONC ARIA SESSION SUMMARY
Course Elapsed Days: 37
Plan Fractions Treated to Date: 4
Plan Prescribed Dose Per Fraction: 2 Gy
Plan Total Fractions Prescribed: 15
Plan Total Prescribed Dose: 30 Gy
Reference Point Dosage Given to Date: 8 Gy
Reference Point Session Dosage Given: 2 Gy
Session Number: 29

## 2022-04-06 ENCOUNTER — Other Ambulatory Visit: Payer: Self-pay

## 2022-04-06 ENCOUNTER — Ambulatory Visit: Payer: BC Managed Care – PPO

## 2022-04-06 ENCOUNTER — Ambulatory Visit
Admission: RE | Admit: 2022-04-06 | Discharge: 2022-04-06 | Disposition: A | Discharge status: Home / Self Care | Payer: BC Managed Care – PPO | Source: Ambulatory Visit | Attending: Radiation Oncology | Admitting: Radiation Oncology

## 2022-04-06 DIAGNOSIS — Z510 Encounter for antineoplastic radiation therapy: Secondary | ICD-10-CM | POA: Diagnosis not present

## 2022-04-06 DIAGNOSIS — Z191 Hormone sensitive malignancy status: Secondary | ICD-10-CM | POA: Diagnosis not present

## 2022-04-06 DIAGNOSIS — C61 Malignant neoplasm of prostate: Secondary | ICD-10-CM | POA: Diagnosis not present

## 2022-04-06 LAB — RAD ONC ARIA SESSION SUMMARY
Course Elapsed Days: 42
Plan Fractions Treated to Date: 5
Plan Prescribed Dose Per Fraction: 2 Gy
Plan Total Fractions Prescribed: 15
Plan Total Prescribed Dose: 30 Gy
Reference Point Dosage Given to Date: 10 Gy
Reference Point Session Dosage Given: 2 Gy
Session Number: 30

## 2022-04-07 ENCOUNTER — Other Ambulatory Visit: Payer: Self-pay

## 2022-04-07 ENCOUNTER — Ambulatory Visit
Admission: RE | Admit: 2022-04-07 | Discharge: 2022-04-07 | Disposition: A | Discharge status: Home / Self Care | Payer: BC Managed Care – PPO | Source: Ambulatory Visit | Attending: Radiation Oncology | Admitting: Radiation Oncology

## 2022-04-07 DIAGNOSIS — Z510 Encounter for antineoplastic radiation therapy: Secondary | ICD-10-CM | POA: Diagnosis not present

## 2022-04-07 DIAGNOSIS — C61 Malignant neoplasm of prostate: Secondary | ICD-10-CM | POA: Diagnosis not present

## 2022-04-07 DIAGNOSIS — Z191 Hormone sensitive malignancy status: Secondary | ICD-10-CM | POA: Diagnosis not present

## 2022-04-07 LAB — RAD ONC ARIA SESSION SUMMARY
Course Elapsed Days: 43
Plan Fractions Treated to Date: 6
Plan Prescribed Dose Per Fraction: 2 Gy
Plan Total Fractions Prescribed: 15
Plan Total Prescribed Dose: 30 Gy
Reference Point Dosage Given to Date: 12 Gy
Reference Point Session Dosage Given: 2 Gy
Session Number: 31

## 2022-04-08 ENCOUNTER — Other Ambulatory Visit: Payer: Self-pay

## 2022-04-08 ENCOUNTER — Ambulatory Visit
Admission: RE | Admit: 2022-04-08 | Discharge: 2022-04-08 | Disposition: A | Discharge status: Home / Self Care | Payer: BC Managed Care – PPO | Source: Ambulatory Visit | Attending: Radiation Oncology | Admitting: Radiation Oncology

## 2022-04-08 DIAGNOSIS — C61 Malignant neoplasm of prostate: Secondary | ICD-10-CM | POA: Diagnosis not present

## 2022-04-08 DIAGNOSIS — Z510 Encounter for antineoplastic radiation therapy: Secondary | ICD-10-CM | POA: Diagnosis not present

## 2022-04-08 DIAGNOSIS — Z191 Hormone sensitive malignancy status: Secondary | ICD-10-CM | POA: Diagnosis not present

## 2022-04-08 LAB — RAD ONC ARIA SESSION SUMMARY
Course Elapsed Days: 44
Plan Fractions Treated to Date: 7
Plan Prescribed Dose Per Fraction: 2 Gy
Plan Total Fractions Prescribed: 15
Plan Total Prescribed Dose: 30 Gy
Reference Point Dosage Given to Date: 14 Gy
Reference Point Session Dosage Given: 2 Gy
Session Number: 32

## 2022-04-09 ENCOUNTER — Other Ambulatory Visit: Payer: Self-pay

## 2022-04-09 ENCOUNTER — Ambulatory Visit
Admission: RE | Admit: 2022-04-09 | Discharge: 2022-04-09 | Disposition: A | Discharge status: Home / Self Care | Payer: BC Managed Care – PPO | Source: Ambulatory Visit | Attending: Radiation Oncology | Admitting: Radiation Oncology

## 2022-04-09 DIAGNOSIS — Z510 Encounter for antineoplastic radiation therapy: Secondary | ICD-10-CM | POA: Diagnosis not present

## 2022-04-09 DIAGNOSIS — Z191 Hormone sensitive malignancy status: Secondary | ICD-10-CM | POA: Diagnosis not present

## 2022-04-09 DIAGNOSIS — C61 Malignant neoplasm of prostate: Secondary | ICD-10-CM | POA: Diagnosis not present

## 2022-04-09 LAB — RAD ONC ARIA SESSION SUMMARY
Course Elapsed Days: 45
Plan Fractions Treated to Date: 8
Plan Prescribed Dose Per Fraction: 2 Gy
Plan Total Fractions Prescribed: 15
Plan Total Prescribed Dose: 30 Gy
Reference Point Dosage Given to Date: 16 Gy
Reference Point Session Dosage Given: 2 Gy
Session Number: 33

## 2022-04-10 ENCOUNTER — Other Ambulatory Visit: Payer: Self-pay

## 2022-04-10 ENCOUNTER — Ambulatory Visit
Admission: RE | Admit: 2022-04-10 | Discharge: 2022-04-10 | Disposition: A | Discharge status: Home / Self Care | Payer: BC Managed Care – PPO | Source: Ambulatory Visit | Attending: Radiation Oncology | Admitting: Radiation Oncology

## 2022-04-10 DIAGNOSIS — Z510 Encounter for antineoplastic radiation therapy: Secondary | ICD-10-CM | POA: Diagnosis not present

## 2022-04-10 DIAGNOSIS — C61 Malignant neoplasm of prostate: Secondary | ICD-10-CM | POA: Diagnosis not present

## 2022-04-10 DIAGNOSIS — Z191 Hormone sensitive malignancy status: Secondary | ICD-10-CM | POA: Diagnosis not present

## 2022-04-10 LAB — RAD ONC ARIA SESSION SUMMARY
Course Elapsed Days: 46
Plan Fractions Treated to Date: 9
Plan Prescribed Dose Per Fraction: 2 Gy
Plan Total Fractions Prescribed: 15
Plan Total Prescribed Dose: 30 Gy
Reference Point Dosage Given to Date: 18 Gy
Reference Point Session Dosage Given: 2 Gy
Session Number: 34

## 2022-04-13 ENCOUNTER — Other Ambulatory Visit: Payer: Self-pay

## 2022-04-13 ENCOUNTER — Ambulatory Visit
Admission: RE | Admit: 2022-04-13 | Discharge: 2022-04-13 | Disposition: A | Discharge status: Home / Self Care | Payer: BC Managed Care – PPO | Source: Ambulatory Visit | Attending: Radiation Oncology | Admitting: Radiation Oncology

## 2022-04-13 DIAGNOSIS — C61 Malignant neoplasm of prostate: Secondary | ICD-10-CM | POA: Diagnosis not present

## 2022-04-13 DIAGNOSIS — Z510 Encounter for antineoplastic radiation therapy: Secondary | ICD-10-CM | POA: Diagnosis not present

## 2022-04-13 DIAGNOSIS — Z191 Hormone sensitive malignancy status: Secondary | ICD-10-CM | POA: Diagnosis not present

## 2022-04-13 LAB — RAD ONC ARIA SESSION SUMMARY
Course Elapsed Days: 49
Plan Fractions Treated to Date: 10
Plan Prescribed Dose Per Fraction: 2 Gy
Plan Total Fractions Prescribed: 15
Plan Total Prescribed Dose: 30 Gy
Reference Point Dosage Given to Date: 20 Gy
Reference Point Session Dosage Given: 2 Gy
Session Number: 35

## 2022-04-14 ENCOUNTER — Ambulatory Visit
Admission: RE | Admit: 2022-04-14 | Discharge: 2022-04-14 | Disposition: A | Discharge status: Home / Self Care | Payer: BC Managed Care – PPO | Source: Ambulatory Visit | Attending: Radiation Oncology | Admitting: Radiation Oncology

## 2022-04-14 ENCOUNTER — Other Ambulatory Visit: Payer: Self-pay

## 2022-04-14 DIAGNOSIS — Z191 Hormone sensitive malignancy status: Secondary | ICD-10-CM | POA: Diagnosis not present

## 2022-04-14 DIAGNOSIS — C61 Malignant neoplasm of prostate: Secondary | ICD-10-CM | POA: Diagnosis not present

## 2022-04-14 DIAGNOSIS — Z510 Encounter for antineoplastic radiation therapy: Secondary | ICD-10-CM | POA: Diagnosis not present

## 2022-04-14 LAB — RAD ONC ARIA SESSION SUMMARY
Course Elapsed Days: 50
Plan Fractions Treated to Date: 11
Plan Prescribed Dose Per Fraction: 2 Gy
Plan Total Fractions Prescribed: 15
Plan Total Prescribed Dose: 30 Gy
Reference Point Dosage Given to Date: 22 Gy
Reference Point Session Dosage Given: 2 Gy
Session Number: 36

## 2022-04-15 ENCOUNTER — Ambulatory Visit
Admission: RE | Admit: 2022-04-15 | Discharge: 2022-04-15 | Disposition: A | Discharge status: Home / Self Care | Payer: BC Managed Care – PPO | Source: Ambulatory Visit | Attending: Radiation Oncology | Admitting: Radiation Oncology

## 2022-04-15 ENCOUNTER — Other Ambulatory Visit: Payer: Self-pay

## 2022-04-15 DIAGNOSIS — Z510 Encounter for antineoplastic radiation therapy: Secondary | ICD-10-CM | POA: Diagnosis not present

## 2022-04-15 DIAGNOSIS — Z191 Hormone sensitive malignancy status: Secondary | ICD-10-CM | POA: Diagnosis not present

## 2022-04-15 DIAGNOSIS — C61 Malignant neoplasm of prostate: Secondary | ICD-10-CM | POA: Diagnosis not present

## 2022-04-15 LAB — RAD ONC ARIA SESSION SUMMARY
Course Elapsed Days: 51
Plan Fractions Treated to Date: 12
Plan Prescribed Dose Per Fraction: 2 Gy
Plan Total Fractions Prescribed: 15
Plan Total Prescribed Dose: 30 Gy
Reference Point Dosage Given to Date: 24 Gy
Reference Point Session Dosage Given: 2 Gy
Session Number: 37

## 2022-04-16 ENCOUNTER — Other Ambulatory Visit: Payer: Self-pay

## 2022-04-16 ENCOUNTER — Ambulatory Visit
Admission: RE | Admit: 2022-04-16 | Discharge: 2022-04-16 | Disposition: A | Discharge status: Home / Self Care | Payer: BC Managed Care – PPO | Source: Ambulatory Visit | Attending: Radiation Oncology | Admitting: Radiation Oncology

## 2022-04-16 DIAGNOSIS — C61 Malignant neoplasm of prostate: Secondary | ICD-10-CM | POA: Diagnosis not present

## 2022-04-16 DIAGNOSIS — Z510 Encounter for antineoplastic radiation therapy: Secondary | ICD-10-CM | POA: Diagnosis not present

## 2022-04-16 DIAGNOSIS — Z191 Hormone sensitive malignancy status: Secondary | ICD-10-CM | POA: Diagnosis not present

## 2022-04-16 DIAGNOSIS — E7800 Pure hypercholesterolemia, unspecified: Secondary | ICD-10-CM | POA: Diagnosis not present

## 2022-04-16 LAB — RAD ONC ARIA SESSION SUMMARY
Course Elapsed Days: 52
Plan Fractions Treated to Date: 13
Plan Prescribed Dose Per Fraction: 2 Gy
Plan Total Fractions Prescribed: 15
Plan Total Prescribed Dose: 30 Gy
Reference Point Dosage Given to Date: 26 Gy
Reference Point Session Dosage Given: 2 Gy
Session Number: 38

## 2022-04-17 ENCOUNTER — Ambulatory Visit: Payer: BC Managed Care – PPO

## 2022-04-17 ENCOUNTER — Ambulatory Visit
Admission: RE | Admit: 2022-04-17 | Discharge: 2022-04-17 | Disposition: A | Discharge status: Home / Self Care | Payer: BC Managed Care – PPO | Source: Ambulatory Visit | Attending: Radiation Oncology | Admitting: Radiation Oncology

## 2022-04-17 ENCOUNTER — Other Ambulatory Visit: Payer: Self-pay

## 2022-04-17 DIAGNOSIS — Z510 Encounter for antineoplastic radiation therapy: Secondary | ICD-10-CM | POA: Diagnosis not present

## 2022-04-17 DIAGNOSIS — Z191 Hormone sensitive malignancy status: Secondary | ICD-10-CM | POA: Diagnosis not present

## 2022-04-17 DIAGNOSIS — C61 Malignant neoplasm of prostate: Secondary | ICD-10-CM | POA: Diagnosis not present

## 2022-04-17 LAB — RAD ONC ARIA SESSION SUMMARY
Course Elapsed Days: 53
Plan Fractions Treated to Date: 14
Plan Prescribed Dose Per Fraction: 2 Gy
Plan Total Fractions Prescribed: 15
Plan Total Prescribed Dose: 30 Gy
Reference Point Dosage Given to Date: 28 Gy
Reference Point Session Dosage Given: 2 Gy
Session Number: 39

## 2022-04-20 ENCOUNTER — Ambulatory Visit: Payer: BC Managed Care – PPO

## 2022-04-20 ENCOUNTER — Ambulatory Visit
Admission: RE | Admit: 2022-04-20 | Discharge: 2022-04-20 | Disposition: A | Discharge status: Home / Self Care | Payer: BC Managed Care – PPO | Source: Ambulatory Visit | Attending: Radiation Oncology | Admitting: Radiation Oncology

## 2022-04-20 ENCOUNTER — Other Ambulatory Visit: Payer: Self-pay

## 2022-04-20 ENCOUNTER — Encounter: Payer: Self-pay | Admitting: Urology

## 2022-04-20 DIAGNOSIS — Z510 Encounter for antineoplastic radiation therapy: Secondary | ICD-10-CM | POA: Diagnosis not present

## 2022-04-20 DIAGNOSIS — Z191 Hormone sensitive malignancy status: Secondary | ICD-10-CM | POA: Diagnosis not present

## 2022-04-20 DIAGNOSIS — C61 Malignant neoplasm of prostate: Secondary | ICD-10-CM | POA: Diagnosis not present

## 2022-04-20 LAB — RAD ONC ARIA SESSION SUMMARY
Course Elapsed Days: 56
Plan Fractions Treated to Date: 15
Plan Prescribed Dose Per Fraction: 2 Gy
Plan Total Fractions Prescribed: 15
Plan Total Prescribed Dose: 30 Gy
Reference Point Dosage Given to Date: 30 Gy
Reference Point Session Dosage Given: 2 Gy
Session Number: 40

## 2022-04-21 ENCOUNTER — Ambulatory Visit: Payer: BC Managed Care – PPO

## 2022-05-15 NOTE — Progress Notes (Signed)
                                                                                                                                                             Patient Name: Peter Stevens MRN: [id number] DOB: 10/27/1960 Referring Physician: Irine Seal (Profile Not Attached) Date of Service: 04/20/2022 [facility], [place]                                                        End Of Treatment Note  Diagnoses: C61-Malignant neoplasm of prostate  Cancer Staging: 62 y.o. gentleman with stage T1c adenocarcinoma of the prostate with a Gleason's score of 4+3 and a PSA of 5.15   Intent: Curative  Radiation Treatment Dates: 02/23/2022 through 04/20/2022 Site Technique Total Dose (Gy) Dose per Fx (Gy) Completed Fx Beam Energies  Prostate: Prostate_Pelvis IMRT 45/45 1.8 25/25 6X  Prostate: Prostate_Bst_IMRT IMRT 30/30 2 15/15 6X   Narrative: The patient tolerated radiation therapy relatively well.  He did report mild fatigue as well as increased nocturia, frequency and urgency which were improved with Flomax.  Plan: The patient will receive a call in about one month from the radiation oncology department. He will continue follow up with his urologist, Dr. Jeffie Pollock, as well. ------------------------------------------------   Tyler Pita, MD [place]: [phone]  Fax: [phone] [email or web address]  Skype  [email or web address]

## 2022-06-01 NOTE — Progress Notes (Signed)
  Radiation Oncology         [phone] ________________________________  Name: Peter Stevens MRN: [id number]  Date of Service: 06/02/2022  DOB: 1960/05/30  Post Treatment Telephone Note  Diagnosis:  62 y.o. gentleman with stage T1c adenocarcinoma of the prostate with a Gleason's score of 4+3 and a PSA of 5.15   Intent: Curative  Radiation Treatment Dates: 02/23/2022 through 04/20/2022 Site Technique Total Dose (Gy) Dose per Fx (Gy) Completed Fx Beam Energies  Prostate: Prostate_Pelvis IMRT 45/45 1.8 25/25 6X  Prostate: Prostate_Bst_IMRT IMRT 30/30 2 15/15 6X  (as documented in provider EOT note)   Pre Treatment IPSS Score: 5 (as documented in the provider consult note)   The patient was available for call today.   Symptoms of fatigue have improved since completing therapy.  Symptoms of bladder changes have improved since completing therapy. Current symptoms include nocturia x2, and medications for bladder symptoms include Tamsulosin.  Symptoms of bowel changes have  improved since completing therapy. Current symptoms include none, and medications for bowel symptoms include none.     Post Treatment IPSS Score: IPSS Questionnaire (AUA-7): Over the past month.   1)  How often have you had a sensation of not emptying your bladder completely after you finish urinating?  2 - Less than half the time  2)  How often have you had to urinate again less than two hours after you finished urinating? 2 - Less than half the time  3)  How often have you found you stopped and started again several times when you urinated?  1 - Less than 1 time in 5  4) How difficult have you found it to postpone urination?  0 - Not at all  5) How often have you had a weak urinary stream?  1 - Less than 1 time in 5  6) How often have you had to push or strain to begin urination?  1 - Less than 1 time in 5  7) How many times did you most typically get up to urinate from the time you went to bed until the  time you got up in the morning?  2 - 2 times  Total score:  9. Which indicates mild symptoms  0-7 mildly symptomatic   8-19 moderately symptomatic   20-35 severely symptomatic    Patient has a scheduled follow up visit with his urologist, Dr. Jeffie Pollock, on 09/23/2022 for ongoing surveillance. He was counseled that PSA levels will be drawn in the urology office, and was reassured that additional time is expected to improve bowel and bladder symptoms. He was encouraged to call back with concerns or questions regarding radiation.  This concludes the interview.   Leandra Kern, LPN

## 2022-06-02 ENCOUNTER — Ambulatory Visit
Admission: RE | Admit: 2022-06-02 | Discharge: 2022-06-02 | Disposition: A | Discharge status: Home / Self Care | Payer: BC Managed Care – PPO | Source: Ambulatory Visit | Attending: Radiation Oncology | Admitting: Radiation Oncology

## 2022-08-21 DIAGNOSIS — C61 Malignant neoplasm of prostate: Secondary | ICD-10-CM | POA: Diagnosis not present

## 2022-09-01 DIAGNOSIS — N401 Enlarged prostate with lower urinary tract symptoms: Secondary | ICD-10-CM | POA: Diagnosis not present

## 2022-09-01 DIAGNOSIS — C61 Malignant neoplasm of prostate: Secondary | ICD-10-CM | POA: Diagnosis not present

## 2022-09-01 DIAGNOSIS — R3912 Poor urinary stream: Secondary | ICD-10-CM | POA: Diagnosis not present

## 2022-09-01 DIAGNOSIS — C775 Secondary and unspecified malignant neoplasm of intrapelvic lymph nodes: Secondary | ICD-10-CM | POA: Diagnosis not present

## 2022-09-03 DIAGNOSIS — R739 Hyperglycemia, unspecified: Secondary | ICD-10-CM | POA: Diagnosis not present

## 2022-09-03 DIAGNOSIS — E7800 Pure hypercholesterolemia, unspecified: Secondary | ICD-10-CM | POA: Diagnosis not present

## 2022-09-03 DIAGNOSIS — Z79899 Other long term (current) drug therapy: Secondary | ICD-10-CM | POA: Diagnosis not present

## 2022-09-03 DIAGNOSIS — S7010XA Contusion of unspecified thigh, initial encounter: Secondary | ICD-10-CM | POA: Diagnosis not present

## 2022-09-16 DIAGNOSIS — M79671 Pain in right foot: Secondary | ICD-10-CM | POA: Diagnosis not present

## 2022-09-16 DIAGNOSIS — M65322 Trigger finger, left index finger: Secondary | ICD-10-CM | POA: Diagnosis not present

## 2022-09-16 DIAGNOSIS — M7632 Iliotibial band syndrome, left leg: Secondary | ICD-10-CM | POA: Diagnosis not present

## 2022-12-24 DIAGNOSIS — R030 Elevated blood-pressure reading, without diagnosis of hypertension: Secondary | ICD-10-CM | POA: Diagnosis not present

## 2022-12-24 DIAGNOSIS — M19049 Primary osteoarthritis, unspecified hand: Secondary | ICD-10-CM | POA: Diagnosis not present

## 2022-12-28 ENCOUNTER — Ambulatory Visit
Admission: RE | Admit: 2022-12-28 | Discharge: 2022-12-28 | Disposition: A | Discharge status: Home / Self Care | Payer: BC Managed Care – PPO | Source: Ambulatory Visit | Attending: Internal Medicine | Admitting: Internal Medicine

## 2022-12-28 ENCOUNTER — Other Ambulatory Visit: Payer: Self-pay | Admitting: Internal Medicine

## 2022-12-28 DIAGNOSIS — M19042 Primary osteoarthritis, left hand: Secondary | ICD-10-CM | POA: Diagnosis not present

## 2022-12-28 DIAGNOSIS — M79641 Pain in right hand: Secondary | ICD-10-CM | POA: Diagnosis not present

## 2022-12-28 DIAGNOSIS — M1990 Unspecified osteoarthritis, unspecified site: Secondary | ICD-10-CM

## 2022-12-28 DIAGNOSIS — M79642 Pain in left hand: Secondary | ICD-10-CM | POA: Diagnosis not present

## 2022-12-28 DIAGNOSIS — M19041 Primary osteoarthritis, right hand: Secondary | ICD-10-CM | POA: Diagnosis not present

## 2023-01-27 DIAGNOSIS — Z0000 Encounter for general adult medical examination without abnormal findings: Secondary | ICD-10-CM | POA: Diagnosis not present

## 2023-02-05 DIAGNOSIS — H2513 Age-related nuclear cataract, bilateral: Secondary | ICD-10-CM | POA: Diagnosis not present

## 2023-02-05 DIAGNOSIS — H53143 Visual discomfort, bilateral: Secondary | ICD-10-CM | POA: Diagnosis not present

## 2023-03-03 DIAGNOSIS — C61 Malignant neoplasm of prostate: Secondary | ICD-10-CM | POA: Diagnosis not present

## 2023-03-05 DIAGNOSIS — M25541 Pain in joints of right hand: Secondary | ICD-10-CM | POA: Diagnosis not present

## 2023-03-05 DIAGNOSIS — M25542 Pain in joints of left hand: Secondary | ICD-10-CM | POA: Diagnosis not present

## 2023-03-05 DIAGNOSIS — M65322 Trigger finger, left index finger: Secondary | ICD-10-CM | POA: Diagnosis not present

## 2023-03-05 DIAGNOSIS — M65311 Trigger thumb, right thumb: Secondary | ICD-10-CM | POA: Diagnosis not present

## 2023-03-10 DIAGNOSIS — R3912 Poor urinary stream: Secondary | ICD-10-CM | POA: Diagnosis not present

## 2023-03-10 DIAGNOSIS — N5201 Erectile dysfunction due to arterial insufficiency: Secondary | ICD-10-CM | POA: Diagnosis not present

## 2023-03-10 DIAGNOSIS — N401 Enlarged prostate with lower urinary tract symptoms: Secondary | ICD-10-CM | POA: Diagnosis not present

## 2023-03-10 DIAGNOSIS — C61 Malignant neoplasm of prostate: Secondary | ICD-10-CM | POA: Diagnosis not present

## 2023-07-23 DIAGNOSIS — M65322 Trigger finger, left index finger: Secondary | ICD-10-CM | POA: Diagnosis not present

## 2023-07-23 DIAGNOSIS — M25542 Pain in joints of left hand: Secondary | ICD-10-CM | POA: Diagnosis not present

## 2023-07-23 DIAGNOSIS — M25541 Pain in joints of right hand: Secondary | ICD-10-CM | POA: Diagnosis not present

## 2023-07-23 DIAGNOSIS — M65311 Trigger thumb, right thumb: Secondary | ICD-10-CM | POA: Diagnosis not present

## 2023-09-01 DIAGNOSIS — C61 Malignant neoplasm of prostate: Secondary | ICD-10-CM | POA: Diagnosis not present

## 2023-09-08 DIAGNOSIS — R351 Nocturia: Secondary | ICD-10-CM | POA: Diagnosis not present

## 2023-09-08 DIAGNOSIS — N5201 Erectile dysfunction due to arterial insufficiency: Secondary | ICD-10-CM | POA: Diagnosis not present

## 2023-09-08 DIAGNOSIS — N401 Enlarged prostate with lower urinary tract symptoms: Secondary | ICD-10-CM | POA: Diagnosis not present

## 2023-09-08 DIAGNOSIS — C61 Malignant neoplasm of prostate: Secondary | ICD-10-CM | POA: Diagnosis not present

## 2023-12-15 DIAGNOSIS — C61 Malignant neoplasm of prostate: Secondary | ICD-10-CM | POA: Diagnosis not present

## 2023-12-29 DIAGNOSIS — Z8546 Personal history of malignant neoplasm of prostate: Secondary | ICD-10-CM | POA: Diagnosis not present

## 2023-12-29 DIAGNOSIS — R351 Nocturia: Secondary | ICD-10-CM | POA: Diagnosis not present

## 2023-12-29 DIAGNOSIS — N5201 Erectile dysfunction due to arterial insufficiency: Secondary | ICD-10-CM | POA: Diagnosis not present

## 2023-12-29 DIAGNOSIS — N401 Enlarged prostate with lower urinary tract symptoms: Secondary | ICD-10-CM | POA: Diagnosis not present

## 2024-02-11 DIAGNOSIS — Z0000 Encounter for general adult medical examination without abnormal findings: Secondary | ICD-10-CM | POA: Diagnosis not present

## 2024-02-11 DIAGNOSIS — C61 Malignant neoplasm of prostate: Secondary | ICD-10-CM | POA: Diagnosis not present

## 2024-02-11 DIAGNOSIS — Z5181 Encounter for therapeutic drug level monitoring: Secondary | ICD-10-CM | POA: Diagnosis not present

## 2024-02-11 DIAGNOSIS — E7800 Pure hypercholesterolemia, unspecified: Secondary | ICD-10-CM | POA: Diagnosis not present

## 2024-02-11 DIAGNOSIS — M19049 Primary osteoarthritis, unspecified hand: Secondary | ICD-10-CM | POA: Diagnosis not present

## 2024-02-11 DIAGNOSIS — R739 Hyperglycemia, unspecified: Secondary | ICD-10-CM | POA: Diagnosis not present
